# Patient Record
Sex: Female | Born: 1995 | Marital: Single | Smoking: Current every day smoker
Health system: Southern US, Community
[De-identification: ages and names within clinical notes are randomized; demographics above are authoritative.]

## PROBLEM LIST (undated history)

## (undated) DIAGNOSIS — Z789 Other specified health status: Secondary | ICD-10-CM

---
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
## 2015-01-21 ENCOUNTER — Emergency Department: Payer: Self-pay | Admitting: Emergency Medicine

## 2015-02-15 ENCOUNTER — Emergency Department: Payer: Self-pay | Admitting: Emergency Medicine

## 2015-02-18 LAB — OB RESULTS CONSOLE PLATELET COUNT: Platelets: 219 10*3/uL

## 2015-02-18 LAB — OB RESULTS CONSOLE HGB/HCT, BLOOD
HEMATOCRIT: 36 %
Hemoglobin: 11.8 g/dL

## 2015-02-18 LAB — OB RESULTS CONSOLE HEPATITIS B SURFACE ANTIGEN: Hepatitis B Surface Ag: NEGATIVE

## 2015-02-18 LAB — OB RESULTS CONSOLE ABO/RH: RH Type: POSITIVE

## 2015-02-18 LAB — OB RESULTS CONSOLE GC/CHLAMYDIA
Chlamydia: NEGATIVE
Gonorrhea: NEGATIVE

## 2015-02-18 LAB — OB RESULTS CONSOLE VARICELLA ZOSTER ANTIBODY, IGG: Varicella: IMMUNE

## 2015-02-18 LAB — OB RESULTS CONSOLE RPR: RPR: NONREACTIVE

## 2015-02-18 LAB — OB RESULTS CONSOLE HIV ANTIBODY (ROUTINE TESTING): HIV: NONREACTIVE

## 2015-02-18 LAB — OB RESULTS CONSOLE RUBELLA ANTIBODY, IGM: Rubella: IMMUNE

## 2015-02-18 LAB — OB RESULTS CONSOLE ANTIBODY SCREEN: Antibody Screen: NEGATIVE

## 2015-05-26 ENCOUNTER — Encounter: Payer: Self-pay | Admitting: *Deleted

## 2015-05-28 ENCOUNTER — Encounter: Payer: Self-pay | Admitting: Obstetrics and Gynecology

## 2015-06-11 ENCOUNTER — Encounter: Payer: Self-pay | Admitting: Obstetrics and Gynecology

## 2015-06-11 ENCOUNTER — Ambulatory Visit (INDEPENDENT_AMBULATORY_CARE_PROVIDER_SITE_OTHER): Payer: Medicaid Other | Admitting: Obstetrics and Gynecology

## 2015-06-11 VITALS — BP 121/82 | HR 80 | Ht 67.0 in | Wt 191.3 lb

## 2015-06-11 DIAGNOSIS — Z3493 Encounter for supervision of normal pregnancy, unspecified, third trimester: Secondary | ICD-10-CM

## 2015-06-11 LAB — POCT URINALYSIS DIPSTICK
BILIRUBIN UA: NEGATIVE
Blood, UA: NEGATIVE
GLUCOSE UA: NEGATIVE
Ketones, UA: NEGATIVE
LEUKOCYTES UA: NEGATIVE
NITRITE UA: NEGATIVE
Spec Grav, UA: 1.01
Urobilinogen, UA: 0.2
pH, UA: 7

## 2015-06-11 MED ORDER — ONDANSETRON 4 MG PO TBDP: 4.0000 mg | ORAL_TABLET | Freq: Four times a day (QID) | ORAL | Status: DC | PRN

## 2015-06-11 NOTE — Progress Notes (Signed)
Pt c/o nausea happens during the day

## 2015-06-11 NOTE — Progress Notes (Signed)
ROB- doing well except continued daily nausea with occasional vomiting- not relieved by diclegis.Glucola next visit; Plans to name female infant Boris LownKayden.  Encouraged to enroll in CBC, Breastfeeding and infant care classes.

## 2015-07-01 ENCOUNTER — Encounter: Payer: Medicaid Other | Admitting: Obstetrics and Gynecology

## 2015-07-24 ENCOUNTER — Ambulatory Visit (INDEPENDENT_AMBULATORY_CARE_PROVIDER_SITE_OTHER): Payer: Medicaid Other | Admitting: Obstetrics and Gynecology

## 2015-07-24 ENCOUNTER — Encounter: Payer: Self-pay | Admitting: Obstetrics and Gynecology

## 2015-07-24 VITALS — BP 129/73 | HR 89 | Wt 193.2 lb

## 2015-07-24 DIAGNOSIS — Z3493 Encounter for supervision of normal pregnancy, unspecified, third trimester: Secondary | ICD-10-CM | POA: Diagnosis not present

## 2015-07-24 DIAGNOSIS — Z23 Encounter for immunization: Secondary | ICD-10-CM | POA: Diagnosis not present

## 2015-07-24 LAB — POCT URINALYSIS DIPSTICK
Bilirubin, UA: NEGATIVE
Blood, UA: NEGATIVE
Glucose, UA: NEGATIVE
Ketones, UA: NEGATIVE
LEUKOCYTES UA: NEGATIVE
NITRITE UA: NEGATIVE
PH UA: 6.5
PROTEIN UA: NEGATIVE
Spec Grav, UA: 1.01
Urobilinogen, UA: 0.2

## 2015-07-24 MED ORDER — TETANUS-DIPHTH-ACELL PERTUSSIS 5-2.5-18.5 LF-MCG/0.5 IM SUSP
0.5000 mL | Freq: Once | INTRAMUSCULAR | Status: AC
  Administered 2015-07-24: 0.5 mL via INTRAMUSCULAR

## 2015-07-24 NOTE — Progress Notes (Signed)
ROB- denies any new complaints 

## 2015-07-24 NOTE — Addendum Note (Signed)
Addended by: Rosine Beat L on: 07/24/2015 12:20 PM   Modules accepted: Orders

## 2015-07-24 NOTE — Progress Notes (Signed)
Rob & Late Glucola- doing well w/o concerns, missed previous appointments due to work- needs note to excuse for dr appt, hasn't made it to classes for the same reason- will look up classes online. Glucola done and Tdap given, blood consent signed.  Also given note to excuse from traffic court on 09/11/15 due to being close to due date.

## 2015-07-25 ENCOUNTER — Telehealth: Payer: Self-pay | Admitting: *Deleted

## 2015-07-25 LAB — HEMOGLOBIN AND HEMATOCRIT, BLOOD
HEMATOCRIT: 32.8 % — AB (ref 34.0–46.6)
Hemoglobin: 11.3 g/dL (ref 11.1–15.9)

## 2015-07-25 LAB — GLUCOSE TOLERANCE, 1 HOUR: GLUCOSE, 1HR PP: 66 mg/dL (ref 65–199)

## 2015-07-25 NOTE — Telephone Encounter (Signed)
Notified pt of normal results 

## 2015-07-25 NOTE — Telephone Encounter (Signed)
-----   Message from Ulyses Amor, [place] sent at 07/25/2015  8:57 AM EDT ----- Please let her know she passed her glucoal and no signs of anemia

## 2015-08-07 ENCOUNTER — Ambulatory Visit (INDEPENDENT_AMBULATORY_CARE_PROVIDER_SITE_OTHER): Payer: Medicaid Other | Admitting: Obstetrics and Gynecology

## 2015-08-07 ENCOUNTER — Encounter: Payer: Self-pay | Admitting: Obstetrics and Gynecology

## 2015-08-07 VITALS — BP 128/82 | HR 81 | Wt 197.3 lb

## 2015-08-07 DIAGNOSIS — Z331 Pregnant state, incidental: Secondary | ICD-10-CM

## 2015-08-07 LAB — POCT URINALYSIS DIPSTICK
Bilirubin, UA: NEGATIVE
Blood, UA: NEGATIVE
GLUCOSE UA: NEGATIVE
Ketones, UA: 5
LEUKOCYTES UA: NEGATIVE
NITRITE UA: NEGATIVE
SPEC GRAV UA: 1.01
UROBILINOGEN UA: 0.2
pH, UA: 6

## 2015-08-07 NOTE — Progress Notes (Signed)
ROB-denies any complaints 

## 2015-08-07 NOTE — Progress Notes (Signed)
ROB- doing well, no concerns, plans FOB and mom for labor support

## 2015-08-21 ENCOUNTER — Ambulatory Visit (INDEPENDENT_AMBULATORY_CARE_PROVIDER_SITE_OTHER): Payer: Medicaid Other | Admitting: Obstetrics and Gynecology

## 2015-08-21 ENCOUNTER — Encounter: Payer: Self-pay | Admitting: Obstetrics and Gynecology

## 2015-08-21 VITALS — BP 127/76 | HR 82 | Wt 196.1 lb

## 2015-08-21 DIAGNOSIS — Z3685 Encounter for antenatal screening for Streptococcus B: Secondary | ICD-10-CM

## 2015-08-21 DIAGNOSIS — Z36 Encounter for antenatal screening of mother: Secondary | ICD-10-CM

## 2015-08-21 DIAGNOSIS — Z113 Encounter for screening for infections with a predominantly sexual mode of transmission: Secondary | ICD-10-CM

## 2015-08-21 DIAGNOSIS — Z3493 Encounter for supervision of normal pregnancy, unspecified, third trimester: Secondary | ICD-10-CM

## 2015-08-21 LAB — POCT URINALYSIS DIPSTICK
BILIRUBIN UA: NEGATIVE
GLUCOSE UA: NEGATIVE
Ketones, UA: NEGATIVE
Leukocytes, UA: NEGATIVE
Nitrite, UA: NEGATIVE
RBC UA: NEGATIVE
SPEC GRAV UA: 1.01
Urobilinogen, UA: 0.2
pH, UA: 6.5

## 2015-08-21 NOTE — Patient Instructions (Signed)
Braxton Hicks Contractions °Contractions of the uterus can occur throughout pregnancy. Contractions are not always a sign that you are in labor.  °WHAT ARE BRAXTON HICKS CONTRACTIONS?  °Contractions that occur before labor are called Braxton Hicks contractions, or false labor. Toward the end of pregnancy (32-34 weeks), these contractions can develop more often and may become more forceful. This is not true labor because these contractions do not result in opening (dilatation) and thinning of the cervix. They are sometimes difficult to tell apart from true labor because these contractions can be forceful and people have different pain tolerances. You should not feel embarrassed if you go to the hospital with false labor. Sometimes, the only way to tell if you are in true labor is for your health care provider to look for changes in the cervix. °If there are no prenatal problems or other health problems associated with the pregnancy, it is completely safe to be sent home with false labor and await the onset of true labor. °HOW CAN YOU TELL THE DIFFERENCE BETWEEN TRUE AND FALSE LABOR? °False Labor °· The contractions of false labor are usually shorter and not as hard as those of true labor.   °· The contractions are usually irregular.   °· The contractions are often felt in the front of the lower abdomen and in the groin.   °· The contractions may go away when you walk around or change positions while lying down.   °· The contractions get weaker and are shorter lasting as time goes on.   °· The contractions do not usually become progressively stronger, regular, and closer together as with true labor.   °True Labor °· Contractions in true labor last 30-70 seconds, become very regular, usually become more intense, and increase in frequency.   °· The contractions do not go away with walking.   °· The discomfort is usually felt in the top of the uterus and spreads to the lower abdomen and low back.   °· True labor can be  determined by your health care provider with an exam. This will show that the cervix is dilating and getting thinner.   °WHAT TO REMEMBER °· Keep up with your usual exercises and follow other instructions given by your health care provider.   °· Take medicines as directed by your health care provider.   °· Keep your regular prenatal appointments.   °· Eat and drink lightly if you think you are going into labor.   °· If Braxton Hicks contractions are making you uncomfortable:   °¨ Change your position from lying down or resting to walking, or from walking to resting.   °¨ Sit and rest in a tub of warm water.   °¨ Drink 2-3 glasses of water. Dehydration may cause these contractions.   °¨ Do slow and deep breathing several times an hour.   °WHEN SHOULD I SEEK IMMEDIATE MEDICAL CARE? °Seek immediate medical care if: °· Your contractions become stronger, more regular, and closer together.   °· You have fluid leaking or gushing from your vagina.   °· You have a fever.   °· You pass blood-tinged mucus.   °· You have vaginal bleeding.   °· You have continuous abdominal pain.   °· You have low back pain that you never had before.   °· You feel your baby's head pushing down and causing pelvic pressure.   °· Your baby is not moving as much as it used to.   °Document Released: 11/29/2005 Document Revised: 12/04/2013 Document Reviewed: 09/10/2013 °ExitCare® Patient Information ©2015 [employer]. This information is not intended to replace advice given to you by your health care   provider. Make sure you discuss any questions you have with your health care provider. ° °

## 2015-08-21 NOTE — Progress Notes (Signed)
ROB-some low back pain, cultures obtained

## 2015-08-21 NOTE — Progress Notes (Signed)
ROB- doing well, occasional cramping with low back pain, labor precautions discussed.

## 2015-08-22 LAB — GC/CHLAMYDIA PROBE AMP
CHLAMYDIA, DNA PROBE: NEGATIVE
Neisseria gonorrhoeae by PCR: NEGATIVE

## 2015-08-23 LAB — STREP GP B NAA: Strep Gp B NAA: NEGATIVE

## 2015-08-28 ENCOUNTER — Ambulatory Visit (INDEPENDENT_AMBULATORY_CARE_PROVIDER_SITE_OTHER): Payer: Medicaid Other | Admitting: Obstetrics and Gynecology

## 2015-08-28 ENCOUNTER — Encounter: Payer: Self-pay | Admitting: Obstetrics and Gynecology

## 2015-08-28 VITALS — BP 128/82 | HR 82 | Wt 196.6 lb

## 2015-08-28 DIAGNOSIS — Z3493 Encounter for supervision of normal pregnancy, unspecified, third trimester: Secondary | ICD-10-CM

## 2015-08-28 LAB — POCT URINALYSIS DIPSTICK
Bilirubin, UA: NEGATIVE
Blood, UA: NEGATIVE
GLUCOSE UA: NEGATIVE
KETONES UA: NEGATIVE
Leukocytes, UA: NEGATIVE
Nitrite, UA: NEGATIVE
Protein, UA: NEGATIVE
SPEC GRAV UA: 1.01
Urobilinogen, UA: 0.2
pH, UA: 7

## 2015-08-28 NOTE — Progress Notes (Signed)
ROB- doing well, notified of negative cultures

## 2015-08-28 NOTE — Progress Notes (Signed)
ROB- denies any new complaints 

## 2015-09-04 ENCOUNTER — Ambulatory Visit (INDEPENDENT_AMBULATORY_CARE_PROVIDER_SITE_OTHER): Payer: Medicaid Other | Admitting: Obstetrics and Gynecology

## 2015-09-04 ENCOUNTER — Encounter: Payer: Medicaid Other | Admitting: Obstetrics and Gynecology

## 2015-09-04 ENCOUNTER — Encounter: Payer: Self-pay | Admitting: Obstetrics and Gynecology

## 2015-09-04 VITALS — BP 130/74 | HR 88 | Wt 199.7 lb

## 2015-09-04 DIAGNOSIS — Z3493 Encounter for supervision of normal pregnancy, unspecified, third trimester: Secondary | ICD-10-CM

## 2015-09-04 LAB — POCT URINALYSIS DIPSTICK
Bilirubin, UA: NEGATIVE
GLUCOSE UA: NEGATIVE
Ketones, UA: NEGATIVE
Leukocytes, UA: NEGATIVE
NITRITE UA: NEGATIVE
Protein, UA: NEGATIVE
RBC UA: NEGATIVE
Spec Grav, UA: 1.01
UROBILINOGEN UA: 0.2
pH, UA: 6.5

## 2015-09-04 NOTE — Progress Notes (Signed)
ROB-denies any complaints 

## 2015-09-04 NOTE — Progress Notes (Signed)
ROB- doing well with no changes

## 2015-09-09 ENCOUNTER — Ambulatory Visit (INDEPENDENT_AMBULATORY_CARE_PROVIDER_SITE_OTHER): Payer: Medicaid Other | Admitting: Obstetrics and Gynecology

## 2015-09-09 ENCOUNTER — Encounter: Payer: Self-pay | Admitting: Obstetrics and Gynecology

## 2015-09-09 VITALS — BP 126/77 | HR 92 | Wt 200.1 lb

## 2015-09-09 DIAGNOSIS — Z3493 Encounter for supervision of normal pregnancy, unspecified, third trimester: Secondary | ICD-10-CM

## 2015-09-09 LAB — POCT URINALYSIS DIPSTICK
Bilirubin, UA: NEGATIVE
Blood, UA: NEGATIVE
Glucose, UA: NEGATIVE
Ketones, UA: NEGATIVE
LEUKOCYTES UA: NEGATIVE
NITRITE UA: 0.2
Spec Grav, UA: 1.015
UROBILINOGEN UA: NEGATIVE
pH, UA: 6

## 2015-09-09 NOTE — Progress Notes (Signed)
ROB- doing well, discussed routine post-date care and testing.

## 2015-09-09 NOTE — Progress Notes (Signed)
ROB-denies any complaints 

## 2015-09-09 NOTE — Patient Instructions (Signed)
Nonstress Test °The nonstress test is a procedure that monitors the fetus's heartbeat. The test will monitor the heartbeat when the fetus is at rest and while the fetus is moving. In a healthy fetus, there will be an increase in fetal heart rate when the fetus moves or kicks. The heart rate will decrease at rest. This test helps determine if the fetus is healthy. Your health care Alexandra Mullins will look at a number of patterns in the heart rate tracing to make sure your baby is thriving. If there is concern, your health care Alexandra Mullins may order additional tests or may suggest another course of action. This test is often done in the third trimester and can help determine if an early delivery is needed and safe. Common reasons to have this test are: °· You are past your due date. °· You have a high-risk pregnancy. °· You are feeling less movement than normal. °· You have lost a pregnancy in the past. °· Your health care Alexandra Mullins suspects fetal growth problems. °· You have too much or too little amniotic fluid. °BEFORE THE PROCEDURE °· Eat a meal right before the test or as directed by your health care Alexandra Mullins. Food may help stimulate fetal movements. °· Use the restroom right before the test. °PROCEDURE °· Two belts will be placed around your abdomen. These belts have monitors attached to them. One records the fetal heart rate and the other records uterine contractions. °· You may be asked to lie down on your side or to stay sitting upright. °· You may be given a button to press when you feel movement. °· The fetal heartbeat is listened to and watched on a screen. The heartbeat is recorded on a sheet of paper. °· If the fetus seems to be sleeping, you may be asked to drink some juice or soda, gently press your abdomen, or make some noise to wake the fetus. °AFTER THE PROCEDURE  °Your health care Alexandra Mullins will discuss the test results with you and make recommendations for the near future. °Document Released: 11/19/2002  Document Revised: 04/15/2014 Document Reviewed: 01/02/2013 °ExitCare® Patient Information ©2015 [employer]. This information is not intended to replace advice given to you by your health care Alexandra Mullins. Make sure you discuss any questions you have with your health care Alexandra Mullins. ° °

## 2015-09-10 ENCOUNTER — Encounter: Payer: Medicaid Other | Admitting: Obstetrics and Gynecology

## 2015-09-11 ENCOUNTER — Encounter: Payer: Medicaid Other | Admitting: Obstetrics and Gynecology

## 2015-09-16 ENCOUNTER — Ambulatory Visit (INDEPENDENT_AMBULATORY_CARE_PROVIDER_SITE_OTHER): Payer: Medicaid Other | Admitting: Obstetrics and Gynecology

## 2015-09-16 ENCOUNTER — Ambulatory Visit: Payer: Medicaid Other

## 2015-09-16 ENCOUNTER — Encounter: Payer: Self-pay | Admitting: Obstetrics and Gynecology

## 2015-09-16 VITALS — BP 127/87 | HR 79 | Wt 201.9 lb

## 2015-09-16 DIAGNOSIS — Z3493 Encounter for supervision of normal pregnancy, unspecified, third trimester: Secondary | ICD-10-CM | POA: Diagnosis not present

## 2015-09-16 LAB — POCT URINALYSIS DIPSTICK
BILIRUBIN UA: NEGATIVE
Glucose, UA: NEGATIVE
KETONES UA: NEGATIVE
LEUKOCYTES UA: NEGATIVE
Nitrite, UA: NEGATIVE
PH UA: 6
PROTEIN UA: NEGATIVE
RBC UA: NEGATIVE
SPEC GRAV UA: 1.015
Urobilinogen, UA: 0.2

## 2015-09-16 NOTE — Progress Notes (Signed)
NST performed today was reviewed and was found to be reactive. Baseline 120 with Moderate variability; No decels noted.  Continue recommended antenatal testing and prenatal care. Denies any changes since last visit.  Ultrasound today reveals:  Indications:Post Dates, EFW, AFI Findings:  Singleton intrauterine pregnancy is visualized with FHR at 116 BPM. Biometrics give an (U/S) Gestational age of 20 4/7 weeks and an (U/S) EDD of 09/19/2015; this correlates with the clinically established EDD of 09/13/2015.  Fetal presentation is Vertex.  EFW: 3954 g, 8 lb 11 oz, 78%. Placenta: fundal. AFI: 10.4 cm.  Follow up anatomic survey is complete and normal; Gender - female  .   Survey of the adnexa demonstrates no adnexal masses. There is no free peritoneal fluid in the cul de sac.  Impression: 1. 39 4/7 week Viable Singleton Intrauterine pregnancy by U/S. 2. (U/S) EDD is consistent with Clinically established (LMP) EDD of 09/13/2015. 3. Normal Follow up anatomy Scan  Recommendations: 1.Clinical correlation with the patient's History and Physical Exam.   Lenoria Farrier, Rad Tech  Scan reviewed and agree with findings; reviewed with patient and FOB.  Melody Ines Bloomer, CNM

## 2015-09-16 NOTE — Progress Notes (Signed)
ROB

## 2015-09-19 ENCOUNTER — Ambulatory Visit (INDEPENDENT_AMBULATORY_CARE_PROVIDER_SITE_OTHER): Payer: Medicaid Other | Admitting: Obstetrics and Gynecology

## 2015-09-19 ENCOUNTER — Encounter: Payer: Self-pay | Admitting: Obstetrics and Gynecology

## 2015-09-19 VITALS — BP 130/77 | HR 97 | Wt 203.4 lb

## 2015-09-19 DIAGNOSIS — Z3493 Encounter for supervision of normal pregnancy, unspecified, third trimester: Secondary | ICD-10-CM

## 2015-09-19 DIAGNOSIS — O480 Post-term pregnancy: Secondary | ICD-10-CM

## 2015-09-19 LAB — POCT URINALYSIS DIPSTICK
BILIRUBIN UA: NEGATIVE
Blood, UA: NEGATIVE
GLUCOSE UA: NEGATIVE
Ketones, UA: NEGATIVE
LEUKOCYTES UA: NEGATIVE
NITRITE UA: NEGATIVE
Protein, UA: NEGATIVE
Spec Grav, UA: 1.01
Urobilinogen, UA: 0.2
pH, UA: 6

## 2015-09-19 NOTE — Progress Notes (Signed)
Alexandra Mullins is having some contractions, slight pelvic pain

## 2015-09-19 NOTE — Progress Notes (Signed)
ROB & NST- doing well, NST reactive with no decels, labor precautions discussed and planned IOL 09/23/15.

## 2015-09-19 NOTE — Patient Instructions (Signed)
Labor Induction Labor induction is when steps are taken to cause a pregnant woman to begin the labor process. Most women go into labor on their own between 37 weeks and 42 weeks of the pregnancy. When this does not happen or when there is a medical need, methods may be used to induce labor. Labor induction causes a pregnant woman's uterus to contract. It also causes the cervix to soften (ripen), open (dilate), and thin out (efface). Usually, labor is not induced before 39 weeks of the pregnancy unless there is a problem with the baby or mother.  Before inducing labor, your health care provider will consider a number of factors, including the following:  The medical condition of you and the baby.   How many weeks along you are.   The status of the baby's lung maturity.   The condition of the cervix.   The position of the baby.  WHAT ARE THE REASONS FOR LABOR INDUCTION? Labor may be induced for the following reasons:  The health of the baby or mother is at risk.   The pregnancy is overdue by 1 week or more.   The water breaks but labor does not start on its own.   The mother has a health condition or serious illness, such as high blood pressure, infection, placental abruption, or diabetes.  The amniotic fluid amounts are low around the baby.   The baby is distressed.  Convenience or wanting the baby to be born on a certain date is not a reason for inducing labor. WHAT METHODS ARE USED FOR LABOR INDUCTION? Several methods of labor induction may be used, such as:   Prostaglandin medicine. This medicine causes the cervix to dilate and ripen. The medicine will also start contractions. It can be taken by mouth or by inserting a suppository into the vagina.   Inserting a thin tube (catheter) with a balloon on the end into the vagina to dilate the cervix. Once inserted, the balloon is expanded with water, which causes the cervix to open.   Stripping the membranes. Your health  care provider separates amniotic sac tissue from the cervix, causing the cervix to be stretched and causing the release of a hormone called progesterone. This may cause the uterus to contract. It is often done during an office visit. You will be sent home to wait for the contractions to begin. You will then come in for an induction.   Breaking the water. Your health care provider makes a hole in the amniotic sac using a small instrument. Once the amniotic sac breaks, contractions should begin. This may still take hours to see an effect.   Medicine to trigger or strengthen contractions. This medicine is given through an IV access tube inserted into a vein in your arm.  All of the methods of induction, besides stripping the membranes, will be done in the hospital. Induction is done in the hospital so that you and the baby can be carefully monitored.  HOW LONG DOES IT TAKE FOR LABOR TO BE INDUCED? Some inductions can take up to 2-3 days. Depending on the cervix, it usually takes less time. It takes longer when you are induced early in the pregnancy or if this is your first pregnancy. If a mother is still pregnant and the induction has been going on for 2-3 days, either the mother will be sent home or a cesarean delivery will be needed. WHAT ARE THE RISKS ASSOCIATED WITH LABOR INDUCTION? Some of the risks of induction include:     Changes in fetal heart rate, such as too high, too low, or erratic.   Fetal distress.   Chance of infection for the mother and baby.   Increased chance of having a cesarean delivery.   Breaking off (abruption) of the placenta from the uterus (rare).   Uterine rupture (very rare).  When induction is needed for medical reasons, the benefits of induction may outweigh the risks. WHAT ARE SOME REASONS FOR NOT INDUCING LABOR? Labor induction should not be done if:   It is shown that your baby does not tolerate labor.   You have had previous surgeries on your  uterus, such as a myomectomy or the removal of fibroids.   Your placenta lies very low in the uterus and blocks the opening of the cervix (placenta previa).   Your baby is not in a head-down position.   The umbilical cord drops down into the birth canal in front of the baby. This could cut off the baby's blood and oxygen supply.   You have had a previous cesarean delivery.   There are unusual circumstances, such as the baby being extremely premature.    This information is not intended to replace advice given to you by your health care provider. Make sure you discuss any questions you have with your health care provider.   Document Released: 04/20/2007 Document Revised: 12/20/2014 Document Reviewed: 06/28/2013 Elsevier Interactive Patient Education 2016 Else[employer] Inc.  

## 2015-09-23 ENCOUNTER — Encounter: Payer: Medicaid Other | Admitting: Obstetrics and Gynecology

## 2015-09-23 ENCOUNTER — Inpatient Hospital Stay
Admission: EM | Admit: 2015-09-23 | Discharge: 2015-09-27 | DRG: 775 | Disposition: A | Discharge status: Home / Self Care | Payer: Medicaid Other | Attending: Obstetrics and Gynecology | Admitting: Obstetrics and Gynecology

## 2015-09-23 ENCOUNTER — Encounter: Payer: Self-pay | Admitting: *Deleted

## 2015-09-23 DIAGNOSIS — Z87891 Personal history of nicotine dependence: Secondary | ICD-10-CM

## 2015-09-23 DIAGNOSIS — Z3403 Encounter for supervision of normal first pregnancy, third trimester: Secondary | ICD-10-CM | POA: Diagnosis not present

## 2015-09-23 DIAGNOSIS — O480 Post-term pregnancy: Secondary | ICD-10-CM | POA: Diagnosis present

## 2015-09-23 DIAGNOSIS — Z3A41 41 weeks gestation of pregnancy: Secondary | ICD-10-CM

## 2015-09-23 DIAGNOSIS — O9081 Anemia of the puerperium: Secondary | ICD-10-CM | POA: Diagnosis present

## 2015-09-23 HISTORY — DX: Other specified health status: Z78.9

## 2015-09-23 LAB — TYPE AND SCREEN
ABO/RH(D): A POS
ANTIBODY SCREEN: NEGATIVE

## 2015-09-23 LAB — CHLAMYDIA/NGC RT PCR (ARMC ONLY)
CHLAMYDIA TR: NOT DETECTED
N GONORRHOEAE: NOT DETECTED

## 2015-09-23 LAB — CBC
HCT: 36.7 % (ref 35.0–47.0)
HEMOGLOBIN: 12.2 g/dL (ref 12.0–16.0)
MCH: 28.1 pg (ref 26.0–34.0)
MCHC: 33.3 g/dL (ref 32.0–36.0)
MCV: 84.4 fL (ref 80.0–100.0)
PLATELETS: 205 10*3/uL (ref 150–440)
RBC: 4.34 MIL/uL (ref 3.80–5.20)
RDW: 13.7 % (ref 11.5–14.5)
WBC: 7.9 10*3/uL (ref 3.6–11.0)

## 2015-09-23 LAB — ABO/RH: ABO/RH(D): A POS

## 2015-09-23 MED ORDER — LIDOCAINE HCL (PF) 1 % IJ SOLN: 30.0000 mL | INTRAMUSCULAR | Status: DC | PRN

## 2015-09-23 MED ORDER — LACTATED RINGERS IV SOLN
INTRAVENOUS | Status: DC
  Administered 2015-09-24: 125 mL/h via INTRAVENOUS
  Administered 2015-09-24: 11:00:00 via INTRAVENOUS
  Administered 2015-09-25: 125 mL/h via INTRAVENOUS

## 2015-09-23 MED ORDER — FENTANYL CITRATE (PF) 100 MCG/2ML IJ SOLN: 50.0000 ug | INTRAMUSCULAR | Status: DC | PRN

## 2015-09-23 MED ORDER — ZOLPIDEM TARTRATE 5 MG PO TABS
5.0000 mg | ORAL_TABLET | Freq: Every evening | ORAL | Status: DC | PRN
  Administered 2015-09-23: 5 mg via ORAL
  Filled 2015-09-23: qty 1

## 2015-09-23 MED ORDER — MISOPROSTOL 25 MCG QUARTER TABLET
50.0000 ug | ORAL_TABLET | ORAL | Status: DC | PRN
  Administered 2015-09-24: 50 ug via VAGINAL
  Filled 2015-09-23 (×2): qty 1

## 2015-09-23 MED ORDER — OXYTOCIN BOLUS FROM INFUSION: 500.0000 mL | INTRAVENOUS | Status: DC

## 2015-09-23 MED ORDER — TERBUTALINE SULFATE 1 MG/ML IJ SOLN: 0.2500 mg | Freq: Once | INTRAMUSCULAR | Status: DC | PRN

## 2015-09-23 MED ORDER — MISOPROSTOL 25 MCG QUARTER TABLET
25.0000 ug | ORAL_TABLET | ORAL | Status: DC | PRN
  Administered 2015-09-23: 50 ug via VAGINAL
  Filled 2015-09-23: qty 1

## 2015-09-23 MED ORDER — LACTATED RINGERS IV SOLN: 500.0000 mL | INTRAVENOUS | Status: DC | PRN

## 2015-09-23 MED ORDER — OXYTOCIN 40 UNITS IN LACTATED RINGERS INFUSION - SIMPLE MED
62.5000 mL/h | INTRAVENOUS | Status: DC
  Filled 2015-09-23: qty 1000

## 2015-09-23 MED ORDER — ONDANSETRON HCL 4 MG/2ML IJ SOLN: 4.0000 mg | Freq: Four times a day (QID) | INTRAMUSCULAR | Status: DC | PRN

## 2015-09-23 MED ORDER — BUTORPHANOL TARTRATE 1 MG/ML IJ SOLN
2.0000 mg | INTRAMUSCULAR | Status: DC | PRN
  Administered 2015-09-24 (×3): 2 mg via INTRAVENOUS
  Filled 2015-09-23 (×3): qty 2

## 2015-09-23 NOTE — H&P (Signed)
Obstetric History and Physical  Verdine Grenfell is a 19 y.o. G1P0 with IUP at [redacted]w[redacted]d presenting for induction of labor. Patient states she has been having  Irregular contractions, none vaginal bleeding, intact membranes, with active fetal movement.    Prenatal Course Source of Care: [facility]  Pregnancy complications or risks: none  Prenatal labs and studies: ABO, Rh: --/--/A POS (10/11 1939) Antibody: NEG (10/11 1939) Rubella: Immune (03/08 0000) RPR: Nonreactive (03/08 0000)  HBsAg: Negative (03/08 0000)  HIV: Non-reactive (03/08 0000)  GBS:  1 hr Glucola  WNL Genetic screening normal Anatomy US normal  Past Medical History  Diagnosis Date  . Medical history non-contributory     History reviewed. No pertinent past surgical history.  OB History  Gravida Para Term Preterm AB SAB TAB Ectopic Multiple Living  1             # Outcome Date GA Lbr Len/2nd Weight Sex Delivery Anes PTL Lv  1 Current               Social History   Social History  . Marital Status: Single    Spouse Name: N/A  . Number of Children: N/A  . Years of Education: N/A   Social History Main Topics  . Smoking status: Former Smoker    Quit date: 01/23/2015  . Smokeless tobacco: Never Used  . Alcohol Use: No  . Drug Use: No  . Sexual Activity: Yes    Birth Control/ Protection: None   Other Topics Concern  . None   Social History Narrative    History reviewed. No pertinent family history.  Prescriptions prior to admission  Medication Sig Dispense Refill Last Dose  . Prenatal Vit-Fe Fumarate-FA (PRENATAL MULTIVITAMIN) TABS tablet Take 1 tablet by mouth daily at 12 noon.   Taking    No Known Allergies  Review of Systems: Negative except for what is mentioned in HPI.  Physical Exam: BP 121/83 mmHg  Pulse 87  Temp(Src) 98 F (36.7 C) (Oral)  Resp 16  Ht  (1.702 m)  Wt 92.08 kg (203 lb)  BMI 31.79 kg/m2  LMP 12/07/2014 (LMP Unknown) GENERAL: Well-developed, well-nourished female in no  acute distress.  LUNGS: Clear to auscultation bilaterally.  HEART: Regular rate and rhythm. ABDOMEN: Soft, nontender, nondistended, gravid. EXTREMITIES: Nontender, no edema, 2+ distal pulses. Cervical Exam: Dilation: Closed Exam by:: C.Elder, CNM student 1st dose of misoprostol placed FHT:  Baseline rate 110 bpm   Variability moderate  Accelerations present   Decelerations none Contractions: Every 5 mins, mild per palp   Pertinent Labs/Studies:   Results for orders placed or performed during the hospital encounter of 09/23/15 (from the past 24 hour(s))  CBC     Status: None   Collection Time: 09/23/15  7:39 PM  Result Value Ref Range   WBC 7.9 3.6 - 11.0 K/uL   RBC 4.34 3.80 - 5.20 MIL/uL   Hemoglobin 12.2 12.0 - 16.0 g/dL   HCT 16.1 09.6 - 04.5 %   MCV 84.4 80.0 - 100.0 fL   MCH 28.1 26.0 - 34.0 pg   MCHC 33.3 32.0 - 36.0 g/dL   RDW 40.9 81.1 - 91.4 %   Platelets 205 150 - 440 K/uL  Type and screen [facility]     Status: None   Collection Time: 09/23/15  7:39 PM  Result Value Ref Range   ABO/RH(D) A POS    Antibody Screen NEG    Sample Expiration  09/26/2015     Assessment : Ralonda Tartt is a 19 y.o. G1P0 at [redacted]w[redacted]d being admitted for post dates induction of labor.  Plan: Labor: Expectant management.  Induction/Augmentation as needed, per protocol with misoprostol  FWB: Reassuring fetal heart tracing.  GBS negative Delivery plan: Hopeful for vaginal delivery Seen with Montell Leopard Ines Bloomer, CNM [facility]  Cassandra Elder, RN-C, SNM

## 2015-09-24 ENCOUNTER — Inpatient Hospital Stay: Payer: Medicaid Other | Admitting: Anesthesiology

## 2015-09-24 LAB — PLATELET COUNT: Platelets: 179 10*3/uL (ref 150–440)

## 2015-09-24 MED ORDER — SODIUM CHLORIDE 0.9 % IJ SOLN
INTRAMUSCULAR | Status: AC
  Filled 2015-09-24: qty 6

## 2015-09-24 MED ORDER — FENTANYL 2.5 MCG/ML W/ROPIVACAINE 0.2% IN NS 100 ML EPIDURAL INFUSION (ARMC-ANES)
EPIDURAL | Status: AC
  Administered 2015-09-24: 10 mL/h via EPIDURAL
  Filled 2015-09-24: qty 100

## 2015-09-24 MED ORDER — INFLUENZA VAC SPLIT QUAD 0.5 ML IM SUSY: 0.5000 mL | PREFILLED_SYRINGE | INTRAMUSCULAR | Status: DC

## 2015-09-24 MED ORDER — OXYTOCIN 40 UNITS IN LACTATED RINGERS INFUSION - SIMPLE MED
1.0000 m[IU]/min | INTRAVENOUS | Status: DC
  Administered 2015-09-24 (×2): 1 m[IU]/min via INTRAVENOUS

## 2015-09-24 MED ORDER — LIDOCAINE HCL (PF) 1 % IJ SOLN
INTRAMUSCULAR | Status: DC
Start: 2015-09-24 — End: 2015-09-25
  Filled 2015-09-24: qty 30

## 2015-09-24 MED ORDER — OXYTOCIN 10 UNIT/ML IJ SOLN
INTRAMUSCULAR | Status: AC
  Filled 2015-09-24: qty 2

## 2015-09-24 MED ORDER — MISOPROSTOL 200 MCG PO TABS
ORAL_TABLET | ORAL | Status: AC
  Administered 2015-09-25: 800 ug via RECTAL
  Filled 2015-09-24: qty 4

## 2015-09-24 MED ORDER — BETAMETHASONE SOD PHOS & ACET 6 (3-3) MG/ML IJ SUSP: 12.0000 mg | Freq: Once | INTRAMUSCULAR | Status: DC

## 2015-09-24 MED ORDER — AMMONIA AROMATIC IN INHA
RESPIRATORY_TRACT | Status: AC
  Filled 2015-09-24: qty 10

## 2015-09-24 NOTE — Progress Notes (Signed)
Dr Henrene HawkingKephart requesting epidural infusion rate be turned up to 20 cc/h x 1 h, with pt on L side

## 2015-09-24 NOTE — Progress Notes (Signed)
Pt unable to discern her level of numbness well in regards to testing with cold alcohol swab. Pt very vague- states, "I don't know, it just hurts here!" Pt indicating L lower abd and supra pubic area. Pt able to move legs well, reports no sensation of numbness or tingling.

## 2015-09-24 NOTE — Progress Notes (Signed)
Alexandra Mullins is a 19 y.o. G1P0 at 4444w4d by LMP admitted for induction of labor due to Post dates. Due date 09/13/2015.  Subjective:   Objective: BP 128/86 mmHg  Pulse 83  Temp(Src) 98.1 F (36.7 C) (Oral)  Resp 16  Ht 5\' 7"  (1.702 m)  Wt 92.08 kg (203 lb)  BMI 31.79 kg/m2  LMP 12/07/2014 (LMP Unknown)      FHT:  FHR: 115-120 bpm, variability: minimal to moderate,  accelerations:  Present,  decelerations:  Absent UC:   irregular, mild per palp SVE:   Dilation: 3 Effacement (%): 70 Exam by:: cke  Labs: Lab Results  Component Value Date   WBC 7.9 09/23/2015   HGB 12.2 09/23/2015   HCT 36.7 09/23/2015   MCV 84.4 09/23/2015   PLT 205 09/23/2015    Assessment / Plan: Postterm pregnancy for IOL, misoprostol x 3 doses will begin pitocin per protocol  Labor: Progressing normally and Dr. Valentino Mullins aware of POC Preeclampsia:  labs stable Fetal Wellbeing:  Category II Pain Control:  Labor support without medications I/D:  n/a Anticipated MOD:  NSVD  Seen with  Alexandra Mullins 09/24/2015, 10:27 AM   Alexandra Isleassandra Elder, RN-C, SNM

## 2015-09-24 NOTE — Anesthesia Preprocedure Evaluation (Signed)
Anesthesia Evaluation  Patient identified by MRN, date of birth, ID band Patient awake    Reviewed: Allergy & Precautions, NPO status , Patient's Chart, lab work & pertinent test results  History of Anesthesia Complications Negative for: history of anesthetic complications  Airway Mallampati: II       Dental  (+) Teeth Intact   Pulmonary neg pulmonary ROS, former smoker,           Cardiovascular negative cardio ROS       Neuro/Psych    GI/Hepatic negative GI ROS, Neg liver ROS,   Endo/Other  negative endocrine ROS  Renal/GU negative Renal ROS     Musculoskeletal   Abdominal   Peds  Hematology negative hematology ROS (+)   Anesthesia Other Findings   Reproductive/Obstetrics                             Anesthesia Physical Anesthesia Plan  ASA: II  Anesthesia Plan: Epidural   Post-op Pain Management:    Induction:   Airway Management Planned:   Additional Equipment:   Intra-op Plan:   Post-operative Plan:   Informed Consent: I have reviewed the patients History and Physical, chart, labs and discussed the procedure including the risks, benefits and alternatives for the proposed anesthesia with the patient or authorized representative who has indicated his/her understanding and acceptance.     Plan Discussed with:   Anesthesia Plan Comments:         Anesthesia Quick Evaluation

## 2015-09-24 NOTE — Anesthesia Procedure Notes (Signed)
Epidural Patient location during procedure: OB  Staffing Performed by: anesthesiologist   Preanesthetic Checklist Completed: patient identified, site marked, surgical consent, pre-op evaluation, timeout performed, IV checked, risks and benefits discussed and monitors and equipment checked  Epidural Patient position: sitting Prep: Betadine Patient monitoring: heart rate, continuous pulse ox and blood pressure Approach: midline Location: L4-L5 Injection technique: LOR saline  Needle:  Needle type: Tuohy  Needle gauge: 18 G Needle length: 9 cm and 9 Needle insertion depth: 8 cm Catheter type: closed end flexible Catheter size: 20 Guage Catheter at skin depth: 10 cm Test dose: negative and 1.5% lidocaine with Epi 1:200 K  Assessment Events: blood not aspirated, injection not painful, no injection resistance, negative IV test and no paresthesia  Additional Notes   Patient tolerated the insertion well without complications.Reason for block:procedure for pain

## 2015-09-24 NOTE — Progress Notes (Signed)
Alexandra Mullins is a 19 y.o. G1P0 at 6639w4d by LMP admitted for induction of labor due to Post dates.   Subjective:   Objective: BP 139/88 mmHg  Pulse 80  Temp(Src) 98.1 F (36.7 C) (Oral)  Resp 16  Ht 5\' 7"  (1.702 m)  Wt 92.08 kg (203 lb)  BMI 31.79 kg/m2  LMP 12/07/2014 (LMP Unknown)      FHT:  FHR: 115 bpm, variability: moderate,  accelerations:  Present,  decelerations:  Absent UC:   irregular, every 2-5 minutes, no pitocin, IUPC replaced, uterus soft tone per palp with IUPC registering hi resting tone SVE:   Dilation: 5 Effacement (%): 80 Station: -1 Exam by:: MNB  Labs: Lab Results  Component Value Date   WBC 7.9 09/23/2015   HGB 12.2 09/23/2015   HCT 36.7 09/23/2015   MCV 84.4 09/23/2015   PLT 205 09/23/2015    Assessment / Plan: Induction of labor due to postterm,  progressing well on pitocin. Have used birthing ball, peanut ball & knee chest position to facilitate fetal descent.   Labor: slow cervical change believed to inadequate UC's, tachysystole prompting prompting pitocin to be discontinued and LOP presentation Preeclampsia:  labs stable Fetal Wellbeing:  Category I  Pain Control:  received Stadol x 1, tolerating current UC's well I/D:  n/a Anticipated MOD:  NSVD  Seen with  Alexandra Mullins 09/24/2015, 5:56 PM   Cassandra Elder, RN-C, SNM

## 2015-09-24 NOTE — Progress Notes (Signed)
Mariea Clontsngel Fanguy is a 19 y.o. G1P0 at 6967w4d by LMP admitted for induction of labor due to Post dates. Due date 09/13/15.  Subjective: Reports continued pain in lower back with contractions  Objective: BP 134/86 mmHg  Pulse 77  Temp(Src) 98.1 F (36.7 C) (Oral)  Resp 16  Ht 5\' 7"  (1.702 m)  Wt 92.08 kg (203 lb)  BMI 31.79 kg/m2  LMP 12/07/2014 (LMP Unknown)      FHT:  FHR: 115 bpm, variability: minimal ,  accelerations:  Present,  decelerations:  Absent UC:   regular, every 2 minutes on 373mu/min of pitocin SVE:   3.5/80/-2. AROM clear fluid- IUPC placed Labs: Lab Results  Component Value Date   WBC 7.9 09/23/2015   HGB 12.2 09/23/2015   HCT 36.7 09/23/2015   MCV 84.4 09/23/2015   PLT 205 09/23/2015    Assessment / Plan: Augmentation of labor, progressing well  Labor: Progressing normally Preeclampsia:  labs stable Fetal Wellbeing:  Category II Pain Control:  Labor support without medications I/D:  n/a Anticipated MOD:  NSVD  Cathyann Kilfoyle Burr 09/24/2015, 12:48 PM

## 2015-09-24 NOTE — Progress Notes (Signed)
Mariea Clontsngel Digiulio is a 19 y.o. G1P0 at 4844w4d by LMP admitted for induction of labor due to Post dates. Due date 09/13/15.  Subjective: Reports lower back pain with contractions, did rest some last night  Objective: BP 118/81 mmHg  Pulse 71  Temp(Src) 98 F (36.7 C) (Oral)  Resp 16  Ht 5\' 7"  (1.702 m)  Wt 92.08 kg (203 lb)  BMI 31.79 kg/m2  LMP 12/07/2014 (LMP Unknown)      FHT:  FHR: 115 bpm, variability: minimal ,  accelerations:  Present,  decelerations:  Absent UC:   regular, every 5-7 minutes SVE:   3/75/-2 per myself, 3rd dose cytotec placed at 0530a Labs: Lab Results  Component Value Date   WBC 7.9 09/23/2015   HGB 12.2 09/23/2015   HCT 36.7 09/23/2015   MCV 84.4 09/23/2015   PLT 205 09/23/2015    Assessment / Plan: Induction of labor due to postterm,  progressing well on pitocin  Labor: Progressing normally Preeclampsia:  labs stable Fetal Wellbeing:  Category III Pain Control:  Labor support without medications I/D:  n/a Anticipated MOD:  NSVD  Melody Burr 09/24/2015, 7:29 AM

## 2015-09-24 NOTE — Progress Notes (Signed)
Alexandra PetitM Burr CNm given update. Also informed that pt not recieiving relief from epidural or epidural measures ordered by Dr Henrene HawkingKephart. Pt aware Dr Henrene HawkingKephart willing to restart a ssecond epidural but pt declining. Pt very distressed with labor and exhaustion. Order received for Iv pain management

## 2015-09-25 DIAGNOSIS — Z3403 Encounter for supervision of normal first pregnancy, third trimester: Secondary | ICD-10-CM

## 2015-09-25 LAB — CBC
HCT: 29.7 % — ABNORMAL LOW (ref 35.0–47.0)
HEMOGLOBIN: 9.9 g/dL — AB (ref 12.0–16.0)
MCH: 27.7 pg (ref 26.0–34.0)
MCHC: 33.2 g/dL (ref 32.0–36.0)
MCV: 83.4 fL (ref 80.0–100.0)
Platelets: 166 10*3/uL (ref 150–440)
RBC: 3.56 MIL/uL — AB (ref 3.80–5.20)
RDW: 13.1 % (ref 11.5–14.5)
WBC: 12 10*3/uL — AB (ref 3.6–11.0)

## 2015-09-25 LAB — RPR: RPR Ser Ql: NONREACTIVE

## 2015-09-25 MED ORDER — ACETAMINOPHEN 325 MG PO TABS
650.0000 mg | ORAL_TABLET | ORAL | Status: DC | PRN
Start: 2015-09-25 — End: 2015-09-27

## 2015-09-25 MED ORDER — WITCH HAZEL-GLYCERIN EX PADS: 1.0000 "application " | MEDICATED_PAD | CUTANEOUS | Status: DC | PRN

## 2015-09-25 MED ORDER — OXYCODONE-ACETAMINOPHEN 5-325 MG PO TABS: 1.0000 | ORAL_TABLET | ORAL | Status: DC | PRN

## 2015-09-25 MED ORDER — SENNOSIDES-DOCUSATE SODIUM 8.6-50 MG PO TABS
2.0000 | ORAL_TABLET | ORAL | Status: DC
  Administered 2015-09-26: 2 via ORAL
  Filled 2015-09-25: qty 2

## 2015-09-25 MED ORDER — BENZOCAINE-MENTHOL 20-0.5 % EX AERO
1.0000 "application " | INHALATION_SPRAY | CUTANEOUS | Status: DC | PRN
  Administered 2015-09-25: 1 via TOPICAL
  Filled 2015-09-25: qty 56

## 2015-09-25 MED ORDER — FERROUS SULFATE 325 (65 FE) MG PO TABS
325.0000 mg | ORAL_TABLET | Freq: Three times a day (TID) | ORAL | Status: DC
  Administered 2015-09-25 – 2015-09-27 (×5): 325 mg via ORAL
  Filled 2015-09-25 (×5): qty 1

## 2015-09-25 MED ORDER — IBUPROFEN 600 MG PO TABS
600.0000 mg | ORAL_TABLET | Freq: Four times a day (QID) | ORAL | Status: DC
  Administered 2015-09-25 – 2015-09-27 (×5): 600 mg via ORAL
  Filled 2015-09-25 (×4): qty 1

## 2015-09-25 MED ORDER — ONDANSETRON HCL 4 MG PO TABS: 4.0000 mg | ORAL_TABLET | ORAL | Status: DC | PRN

## 2015-09-25 MED ORDER — OXYCODONE-ACETAMINOPHEN 5-325 MG PO TABS: 2.0000 | ORAL_TABLET | ORAL | Status: DC | PRN

## 2015-09-25 MED ORDER — DIBUCAINE 1 % RE OINT
1.0000 | TOPICAL_OINTMENT | RECTAL | Status: DC | PRN
Start: 2015-09-25 — End: 2015-09-27

## 2015-09-25 MED ORDER — IBUPROFEN 600 MG PO TABS
ORAL_TABLET | ORAL | Status: AC
  Administered 2015-09-25: 600 mg
  Filled 2015-09-25: qty 1

## 2015-09-25 MED ORDER — OXYTOCIN 40 UNITS IN LACTATED RINGERS INFUSION - SIMPLE MED
INTRAVENOUS | Status: AC
  Filled 2015-09-25: qty 1000

## 2015-09-25 MED ORDER — DIPHENHYDRAMINE HCL 25 MG PO CAPS: 25.0000 mg | ORAL_CAPSULE | Freq: Four times a day (QID) | ORAL | Status: DC | PRN

## 2015-09-25 MED ORDER — ZOLPIDEM TARTRATE 5 MG PO TABS: 5.0000 mg | ORAL_TABLET | Freq: Every evening | ORAL | Status: DC | PRN

## 2015-09-25 MED ORDER — BUTORPHANOL TARTRATE 1 MG/ML IJ SOLN
2.0000 mg | Freq: Once | INTRAMUSCULAR | Status: AC
  Administered 2015-09-25: 2 mg via INTRAVENOUS
  Filled 2015-09-25: qty 2

## 2015-09-25 MED ORDER — PRENATAL MULTIVITAMIN CH
1.0000 | ORAL_TABLET | Freq: Every day | ORAL | Status: DC
  Administered 2015-09-25 – 2015-09-27 (×3): 1 via ORAL
  Filled 2015-09-25 (×3): qty 1

## 2015-09-25 MED ORDER — ONDANSETRON HCL 4 MG/2ML IJ SOLN: 4.0000 mg | INTRAMUSCULAR | Status: DC | PRN

## 2015-09-25 MED ORDER — LANOLIN HYDROUS EX OINT: TOPICAL_OINTMENT | CUTANEOUS | Status: DC | PRN

## 2015-09-25 MED ORDER — SIMETHICONE 80 MG PO CHEW
80.0000 mg | CHEWABLE_TABLET | ORAL | Status: DC | PRN
Start: 2015-09-25 — End: 2015-09-27

## 2015-09-25 NOTE — Progress Notes (Signed)
Post Partum Day 0 Subjective: up ad lib and voiding  Objective: Blood pressure 125/79, pulse 73, temperature 98.9 F (37.2 C), temperature source Oral, resp. rate 20, height 5\' 7"  (1.702 m), weight 203 lb (92.08 kg), last menstrual period 12/07/2014, SpO2 99 %, unknown if currently breastfeeding.  Physical Exam:  General: alert, cooperative, appears stated age and fatigued Lochia: appropriate Uterine Fundus: firm Incision: NA DVT Evaluation: No evidence of DVT seen on physical exam. Negative Homan's sign.   Recent Labs  09/23/15 1939 09/25/15 0855  HGB 12.2 9.9*  HCT 36.7 29.7*    Assessment/Plan: Plan for discharge tomorrow, Breastfeeding and Circumcision prior to discharge Infant feeding only Breast Anemic- added ferrous 325mg  tid    LOS: 2 days   Melody Burr 09/25/2015, 12:33 PM

## 2015-09-25 NOTE — Anesthesia Postprocedure Evaluation (Signed)
  Anesthesia Post-op Note  Patient: Alexandra Mullins  Procedure(s) Performed: * No procedures listed *  Anesthesia type:Epidural  Patient location: PACU  Post pain: Pain level controlled  Post assessment: Post-op Vital signs reviewed, Patient's Cardiovascular Status Stable, Respiratory Function Stable, Patent Airway and No signs of Nausea or vomiting  Post vital signs: Reviewed and stable  Last Vitals:  Filed Vitals:   09/25/15 0610  BP: 134/87  Pulse: 78  Temp:   Resp: 20    Level of consciousness: awake, alert  and patient cooperative  Complications: No apparent anesthesia complications

## 2015-09-25 NOTE — Anesthesia Postprocedure Evaluation (Incomplete)
  Anesthesia Post-op Note  Patient: Alexandra Mullins  Procedure(s) Performed: * No procedures listed *  Anesthesia type:Epidural  Patient location: PACU  Post pain: Pain level controlled  Post assessment: Post-op Vital signs reviewed, Patient's Cardiovascular Status Stable, Respiratory Function Stable, Patent Airway and No signs of Nausea or vomiting  Post vital signs: Reviewed and stable  Last Vitals:  Filed Vitals:   09/25/15 0610  BP: 134/87  Pulse: 78  Temp:   Resp: 20    Level of consciousness: awake, alert  and patient cooperative  Complications: No apparent anesthesia complications  

## 2015-09-25 NOTE — Progress Notes (Signed)
C Elder CNM texting Donnetta HailM Burr CNM with report. Pt taught how to self massage uterus, encouraged to take some po pain med with a little food and then void.  Pt agreeable

## 2015-09-25 NOTE — Progress Notes (Signed)
Off going RN stating that CNM Alexandra Mullins aware of uncoordinated u/c pattern and requesting [facility] through such in effort to achieve a better pattern

## 2015-09-26 MED ORDER — VITAMIN D 50 MCG (2000 UT) PO TABS: 2000.0000 [IU] | ORAL_TABLET | Freq: Every day | ORAL | Status: DC

## 2015-09-26 MED ORDER — FUSION PLUS PO CAPS: 1.0000 | ORAL_CAPSULE | Freq: Every day | ORAL | Status: DC

## 2015-09-26 NOTE — Discharge Summary (Signed)
Obstetric Discharge Summary Reason for Admission: induction of labor Prenatal Procedures: NST and ultrasound Intrapartum Procedures: spontaneous vaginal delivery Postpartum Procedures: none Complications-Operative and Postpartum: anemia HEMOGLOBIN  Date Value Ref Range Status  09/25/2015 9.9* 12.0 - 16.0 g/dL Final  09/60/454003/07/2015 98.111.8 g/dL Final   HCT  Date Value Ref Range Status  09/25/2015 29.7* 35.0 - 47.0 % Final  02/18/2015 36 % Final   HEMATOCRIT  Date Value Ref Range Status  07/24/2015 32.8* 34.0 - 46.6 % Final    Physical Exam:  General: alert, cooperative, appears stated age and pale Lochia: appropriate Uterine Fundus: firm Incision: NA DVT Evaluation: No evidence of DVT seen on physical exam. Negative Homan's sign.  Discharge Diagnoses: Post-date pregnancy and svd  Discharge Information: Date: 09/26/2015 Activity: pelvic rest Diet: routine Medications: PNV, Colace, Iron and plans nexplanon PP Condition: stable Instructions: refer to practice specific booklet Discharge to: home   Newborn Data: Live born female 'Kayden" place post-discharge circumcision Birth Weight: 7 lb 12.3 oz (3525 g) APGAR: 8, 9  Home with mother.  Willadene Mounsey Burr 09/26/2015, 12:51 PM

## 2015-09-26 NOTE — Lactation Note (Signed)
This note was copied from the chart of Alexandra Mullins. Lactation Consultation Note  Patient Name: Alexandra Mariea Clontsngel Beitler UXLKG'MToday's Date: 09/26/2015     Maternal Data  MOM states baby breastfeeding better today, no troubles latching, some nipple tenderness, lanolin given with instruction in use, possible discharge tonight Fe[facility]    [facility]                      Lactation Tools Discussed/Used     Consult Status      Dyann KiefMarsha D Shatana Saxton 09/26/2015, 6:30 PM

## 2015-09-27 NOTE — Discharge Instructions (Signed)
Call your doctor for increased pain or vaginal bleeding, temperature above 100.4, depression, or concerns.  No strenuous activity or heavy lifting for 6 weeks.  No intercourse, tampons, douching, or enemas for 6 weeks.  No tub baths-showers only.  No driving for 2 weeks or while taking pain medications.  Continue prenatal vitamin and iron.  Increase calories and fluids while breastfeeding. °

## 2015-09-27 NOTE — Progress Notes (Signed)
Discharged inst reviewed Pt v/o full understanding of all inst. Discharged home via w/c Hazle QuantElaine J Jenice Leiner, RN 09/27/2015 8:51 PM

## 2015-10-30 ENCOUNTER — Encounter: Payer: Self-pay | Admitting: Obstetrics and Gynecology

## 2015-10-30 ENCOUNTER — Ambulatory Visit (INDEPENDENT_AMBULATORY_CARE_PROVIDER_SITE_OTHER): Payer: Medicaid Other | Admitting: Obstetrics and Gynecology

## 2015-10-30 VITALS — BP 113/72 | HR 99 | Ht 67.0 in | Wt 186.8 lb

## 2015-10-30 DIAGNOSIS — Z30018 Encounter for initial prescription of other contraceptives: Secondary | ICD-10-CM

## 2015-10-30 DIAGNOSIS — Z30017 Encounter for initial prescription of implantable subdermal contraceptive: Secondary | ICD-10-CM

## 2015-10-30 LAB — POCT URINE PREGNANCY: PREG TEST UR: NEGATIVE

## 2015-10-30 MED ORDER — ETONOGESTREL 68 MG ~~LOC~~ IMPL: 1.0000 | DRUG_IMPLANT | Freq: Once | SUBCUTANEOUS | Status: AC

## 2015-10-30 NOTE — Progress Notes (Signed)
Alexandra Mullins is a 19 y.o. year old African American female here for Nexplanon insertion.  No LMP recorded., last sexual intercourse was last week, and her pregnancy test today was negative.  Risks/benefits/side effects of Nexplanon have been discussed and her questions have been answered.  Specifically, a failure rate of 12/998 has been reported, with an increased failure rate if pt takes St. John's Wort and/or antiseizure medicaitons.  Alexandra Mullins is aware of the common side effect of irregular bleeding, which the incidence of decreases over time.  BP 113/72 mmHg  Pulse 99  Ht 5\' 7"  (1.702 m)  Wt 186 lb 12.8 oz (84.732 kg)  BMI 29.25 kg/m2  Breastfeeding? No  Results for orders placed or performed in visit on 10/30/15 (from the past 24 hour(s))  POCT urine pregnancy   Collection Time: 10/30/15 11:16 AM  Result Value Ref Range   Preg Test, Ur Negative Negative     She is right-handed, so her left arm, approximately 4 inches proximal from the elbow, was cleansed with alcohol and anesthetized with 2cc of 2% Lidocaine.  The area was cleansed again with betadine and the Nexplanon was inserted per manufacturer's recommendations without difficulty.  A steri-strip and pressure bandage were applied.  Pt was instructed to keep the area clean and dry, remove pressure bandage in 24 hours, and keep insertion site covered with the steri-strip for 3-5 days.  Back up contraception was recommended for 2 weeks.  She was given a card indicating date Nexplanon was inserted and date it needs to be removed. Follow-up PRN problems.  Melody Burr,CNM

## 2015-10-30 NOTE — Patient Instructions (Signed)
Etonogestrel implant What is this medicine? ETONOGESTREL (et oh noe JES trel) is a contraceptive (birth control) device. It is used to prevent pregnancy. It can be used for up to 3 years. This medicine may be used for other purposes; ask your health care provider or pharmacist if you have questions. What should I tell my health care provider before I take this medicine? They need to know if you have any of these conditions: -abnormal vaginal bleeding -blood vessel disease or blood clots -cancer of the breast, cervix, or liver -depression -diabetes -gallbladder disease -headaches -heart disease or recent heart attack -high blood pressure -high cholesterol -kidney disease -liver disease -renal disease -seizures -tobacco smoker -an unusual or allergic reaction to etonogestrel, other hormones, anesthetics or antiseptics, medicines, foods, dyes, or preservatives -pregnant or trying to get pregnant -breast-feeding How should I use this medicine? This device is inserted just under the skin on the inner side of your upper arm by a health care professional. Talk to your pediatrician regarding the use of this medicine in children. Special care may be needed. Overdosage: If you think you have taken too much of this medicine contact a poison control center or emergency room at once. NOTE: This medicine is only for you. Do not share this medicine with others. What if I miss a dose? This does not apply. What may interact with this medicine? Do not take this medicine with any of the following medications: -amprenavir -bosentan -fosamprenavir This medicine may also interact with the following medications: -barbiturate medicines for inducing sleep or treating seizures -certain medicines for fungal infections like ketoconazole and itraconazole -griseofulvin -medicines to treat seizures like carbamazepine, felbamate, oxcarbazepine, phenytoin,  topiramate -modafinil -phenylbutazone -rifampin -some medicines to treat HIV infection like atazanavir, indinavir, lopinavir, nelfinavir, tipranavir, ritonavir -St. John's wort This list may not describe all possible interactions. Give your health care provider a list of all the medicines, herbs, non-prescription drugs, or dietary supplements you use. Also tell them if you smoke, drink alcohol, or use illegal drugs. Some items may interact with your medicine. What should I watch for while using this medicine? This product does not protect you against HIV infection (AIDS) or other sexually transmitted diseases. You should be able to feel the implant by pressing your fingertips over the skin where it was inserted. Contact your doctor if you cannot feel the implant, and use a non-hormonal birth control method (such as condoms) until your doctor confirms that the implant is in place. If you feel that the implant may have broken or become bent while in your arm, contact your healthcare provider. What side effects may I notice from receiving this medicine? Side effects that you should report to your doctor or health care professional as soon as possible: -allergic reactions like skin rash, itching or hives, swelling of the face, lips, or tongue -breast lumps -changes in emotions or moods -depressed mood -heavy or prolonged menstrual bleeding -pain, irritation, swelling, or bruising at the insertion site -scar at site of insertion -signs of infection at the insertion site such as fever, and skin redness, pain or discharge -signs of pregnancy -signs and symptoms of a blood clot such as breathing problems; changes in vision; chest pain; severe, sudden headache; pain, swelling, warmth in the leg; trouble speaking; sudden numbness or weakness of the face, arm or leg -signs and symptoms of liver injury like dark yellow or brown urine; general ill feeling or flu-like symptoms; light-colored stools; loss of  appetite; nausea; right upper belly   pain; unusually weak or tired; yellowing of the eyes or skin -unusual vaginal bleeding, discharge -signs and symptoms of a stroke like changes in vision; confusion; trouble speaking or understanding; severe headaches; sudden numbness or weakness of the face, arm or leg; trouble walking; dizziness; loss of balance or coordination Side effects that usually do not require medical attention (Report these to your doctor or health care professional if they continue or are bothersome.): -acne -back pain -breast pain -changes in weight -dizziness -general ill feeling or flu-like symptoms -headache -irregular menstrual bleeding -nausea -sore throat -vaginal irritation or inflammation This list may not describe all possible side effects. Call your doctor for medical advice about side effects. You may report side effects to FDA at 1-800-FDA-1088. Where should I keep my medicine? This drug is given in a hospital or clinic and will not be stored at home. NOTE: This sheet is a summary. It may not cover all possible information. If you have questions about this medicine, talk to your doctor, pharmacist, or health care provider.    2016, Elsevier/Gold Standard. (2014-09-13 14:07:06)  

## 2015-11-05 ENCOUNTER — Ambulatory Visit: Payer: Medicaid Other | Admitting: Obstetrics and Gynecology

## 2015-11-13 ENCOUNTER — Ambulatory Visit (INDEPENDENT_AMBULATORY_CARE_PROVIDER_SITE_OTHER): Payer: Medicaid Other | Admitting: Obstetrics and Gynecology

## 2015-11-13 ENCOUNTER — Encounter: Payer: Self-pay | Admitting: Obstetrics and Gynecology

## 2015-11-13 NOTE — Patient Instructions (Signed)
  Place postpartum visit patient instructions here.  

## 2015-11-13 NOTE — Progress Notes (Signed)
  Subjective:     Alexandra Clontsngel Mullins is a 19 y.o. female who presents for a postpartum visit. She is 6 weeks postpartum following a spontaneous vaginal delivery. I have fully reviewed the prenatal and intrapartum course. The delivery was at 39 gestational weeks. Outcome: spontaneous vaginal delivery. Anesthesia: none. Postpartum course has been uneventful. Baby's course has been uneventful. Baby is feeding by formula. Bleeding moderate lochia. Bowel function is normal. Bladder function is normal. Patient is sexually active. Contraception method is Nexplanon. Postpartum depression screening: negative.  The following portions of the patient's history were reviewed and updated as appropriate: allergies, current medications, past family history, past medical history, past social history, past surgical history and problem list.  Review of Systems A comprehensive review of systems was negative.   Objective:    BP 109/81 mmHg  Pulse 107  Ht 5\' 7"  (1.702 m)  Wt 189 lb 14.4 oz (86.138 kg)  BMI 29.74 kg/m2  LMP 10/30/2015 (Exact Date)  Breastfeeding? No  General:  alert, cooperative and appears stated age   Breasts:  inspection negative, no nipple discharge or bleeding, no masses or nodularity palpable  Lungs: clear to auscultation bilaterally  Heart:  regular rate and rhythm, S1, S2 normal, no murmur, click, rub or gallop  Abdomen: soft, non-tender; bowel sounds normal; no masses,  no organomegaly   Vulva:  normal  Vagina: normal vagina  Cervix:  multiparous appearance  Corpus: normal size, contour, position, consistency, mobility, non-tender  Adnexa:  normal adnexa  Rectal Exam: Not performed.       Thyroid slightly enlarged Assessment:     6 weeks postpartum exam. Pap smear not done at today's visit.   Plan:    1. Contraception: Nexplanon 2. rechak labs for anemia and thyroid function 3. Follow up in: 3 months or as needed.

## 2015-11-14 ENCOUNTER — Other Ambulatory Visit: Payer: Self-pay | Admitting: Obstetrics and Gynecology

## 2015-11-14 DIAGNOSIS — E559 Vitamin D deficiency, unspecified: Secondary | ICD-10-CM

## 2015-11-14 LAB — CBC
HEMATOCRIT: 40.1 % (ref 34.0–46.6)
Hemoglobin: 13 g/dL (ref 11.1–15.9)
MCH: 26.9 pg (ref 26.6–33.0)
MCHC: 32.4 g/dL (ref 31.5–35.7)
MCV: 83 fL (ref 79–97)
PLATELETS: 356 10*3/uL (ref 150–379)
RBC: 4.84 x10E6/uL (ref 3.77–5.28)
RDW: 14.7 % (ref 12.3–15.4)
WBC: 7.6 10*3/uL (ref 3.4–10.8)

## 2015-11-14 LAB — THYROID PANEL WITH TSH
FREE THYROXINE INDEX: 2.2 (ref 1.2–4.9)
T3 UPTAKE RATIO: 27 % (ref 24–39)
T4 TOTAL: 8.2 ug/dL (ref 4.5–12.0)
TSH: 0.934 u[IU]/mL (ref 0.450–4.500)

## 2015-11-14 LAB — VITAMIN D 25 HYDROXY (VIT D DEFICIENCY, FRACTURES): Vit D, 25-Hydroxy: 16.3 ng/mL — ABNORMAL LOW (ref 30.0–100.0)

## 2015-11-14 LAB — IRON: Iron: 56 ug/dL (ref 27–159)

## 2015-11-14 MED ORDER — VITAMIN D (ERGOCALCIFEROL) 1.25 MG (50000 UNIT) PO CAPS: 50000.0000 [IU] | ORAL_CAPSULE | ORAL | Status: DC

## 2015-12-01 ENCOUNTER — Telehealth: Payer: Self-pay | Admitting: *Deleted

## 2015-12-01 NOTE — Telephone Encounter (Signed)
-----   Message from Purcell NailsMelody N Shambley, [place] sent at 11/14/2015 12:28 PM EST ----- Please let her know iron is good but vit D is really low- and send info on vit D def.  i sent in rx for 2x week supplement, and want hr to come by in Feb to have levels rechecked.

## 2015-12-01 NOTE — Telephone Encounter (Signed)
Notified pt. 

## 2017-04-23 ENCOUNTER — Encounter: Payer: Self-pay | Admitting: Emergency Medicine

## 2017-04-23 ENCOUNTER — Emergency Department
Admission: EM | Admit: 2017-04-23 | Discharge: 2017-04-23 | Disposition: A | Discharge status: Home / Self Care | Payer: Medicaid Other | Attending: Emergency Medicine | Admitting: Emergency Medicine

## 2017-04-23 DIAGNOSIS — J029 Acute pharyngitis, unspecified: Secondary | ICD-10-CM | POA: Diagnosis present

## 2017-04-23 DIAGNOSIS — F17210 Nicotine dependence, cigarettes, uncomplicated: Secondary | ICD-10-CM | POA: Diagnosis not present

## 2017-04-23 LAB — POCT RAPID STREP A: Streptococcus, Group A Screen (Direct): NEGATIVE

## 2017-04-23 MED ORDER — AMOXICILLIN 500 MG PO CAPS: 500.0000 mg | ORAL_CAPSULE | Freq: Two times a day (BID) | ORAL | 0 refills | Status: AC

## 2017-04-23 MED ORDER — LIDOCAINE VISCOUS 2 % MT SOLN: 10.0000 mL | OROMUCOSAL | 0 refills | Status: DC | PRN

## 2017-04-23 NOTE — ED Triage Notes (Signed)
Sore throat x 2 days

## 2017-04-23 NOTE — ED Provider Notes (Signed)
[facility] Emergency Department Provider Note  ____________________________________________  Time seen: Approximately 7:51 AM  I have reviewed the triage vital signs and the nursing notes.   HISTORY  Chief Complaint Sore Throat    HPI Alexandra Mullins is a 21 y.o. female that presents to the emergency department with sore throat for 2 days. She is unsure if she has had a fever. She is eating and drinking normally. She was exposed to strep throat this week. She denies headache, congestion, cough, shortness of breath, chest pain, nausea, vomiting, abdominal pain.   Past Medical History:  Diagnosis Date  . Medical history non-contributory     Patient Active Problem List   Diagnosis Date Noted  . Labor and delivery, indication for care 09/23/2015    History reviewed. No pertinent surgical history.  Prior to Admission medications   Medication Sig Start Date End Date Taking? Authorizing Provider  amoxicillin (AMOXIL) 500 MG capsule Take 1 capsule (500 mg total) by mouth 2 (two) times daily. 04/23/17 05/03/17  Enid Derry, PA-C  etonogestrel (NEXPLANON) 68 MG IMPL implant 1 each (68 mg total) by Subdermal route once. 10/30/15   Shambley, Melody N, CNM  lidocaine (XYLOCAINE) 2 % solution Use as directed 10 mLs in the mouth or throat as needed for mouth pain. 04/23/17   Enid Derry, PA-C  Prenatal Vit-Fe Fumarate-FA (PRENATAL MULTIVITAMIN) TABS tablet Take 1 tablet by mouth daily at 12 noon.    [provider]  Vitamin D, Ergocalciferol, (DRISDOL) 50000 UNITS CAPS capsule Take 1 capsule (50,000 Units total) by mouth 2 (two) times a week. 11/14/15   Purcell Nails, CNM    Allergies Patient has no known allergies.  No family history on file.  Social History Social History  Substance Use Topics  . Smoking status: Current Every Day Smoker    Types: Cigars    Last attempt to quit: 01/23/2015  . Smokeless tobacco: Never Used     Comment: one  black and mild per day  . Alcohol use No     Review of Systems  Eyes: No visual changes. No discharge. ENT: Negative for congestion and rhinorrhea. Cardiovascular: No chest pain. Respiratory: Negative for cough. No SOB. Gastrointestinal: No abdominal pain.  No nausea, no vomiting.   Musculoskeletal: Negative for musculoskeletal pain. Skin: Negative for rash, abrasions, lacerations, ecchymosis. Neurological: Negative for headaches.   ____________________________________________   PHYSICAL EXAM:  VITAL SIGNS: ED Triage Vitals  Enc Vitals Group     BP 04/23/17 0708 128/76     Pulse Rate 04/23/17 0708 94     Resp 04/23/17 0708 18     Temp 04/23/17 0708 99.3 F (37.4 C)     Temp Source 04/23/17 0708 Oral     SpO2 04/23/17 0708 98 %     Weight 04/23/17 0709 170 lb (77.1 kg)     Height 04/23/17 0709 5\' 7"  (1.702 m)     Head Circumference --      Peak Flow --      Pain Score 04/23/17 0708 9     Pain Loc --      Pain Edu? --      Excl. in GC? --      Constitutional: Alert and oriented. Well appearing and in no acute distress. Eyes: Conjunctivae are normal. PERRL. EOMI. No discharge. Head: Atraumatic. ENT: No frontal and maxillary sinus tenderness.      Ears: Tympanic membranes pearly gray with good landmarks. No discharge.  Nose: No congestion/rhinnorhea.      Mouth/Throat: Mucous membranes are moist. Oropharynx erythematous. Tonsils enlarged bilaterally. No exudates. Uvula midline. Neck: No stridor.   Hematological/Lymphatic/Immunilogical: Anterior cervical lymphadenopathy. Cardiovascular: Normal rate, regular rhythm.  Good peripheral circulation. Respiratory: Normal respiratory effort without tachypnea or retractions. Lungs CTAB. Good air entry to the bases with no decreased or absent breath sounds. Gastrointestinal: Bowel sounds 4 quadrants. Soft and nontender to palpation. No guarding or rigidity. No palpable masses. No distention. Musculoskeletal: Full range of  motion to all extremities. No gross deformities appreciated. Neurologic:  Normal speech and language. No gross focal neurologic deficits are appreciated.  Skin:  Skin is warm, dry and intact. No rash noted.   ____________________________________________   LABS (all labs ordered are listed, but only abnormal results are displayed)  Labs Reviewed  CULTURE, GROUP A STREP [facility])  POCT RAPID STREP A   ____________________________________________  EKG   ____________________________________________  RADIOLOGY   No results found.  ____________________________________________    PROCEDURES  Procedure(s) performed:    Procedures    Medications - No data to display   ____________________________________________   INITIAL IMPRESSION / ASSESSMENT AND PLAN / ED COURSE  Pertinent labs & imaging results that were available during my care of the patient were reviewed by me and considered in my medical decision making (see chart for details).  Review of the [place] CSRS was performed in accordance of the [facility] prior to dispensing any controlled drugs.     Patient's diagnosis is consistent with pharyngitis. Vital signs and exam are reassuring. Strep test negative. This will be sent for culture. She was exposed to strep this week so I will write prescription for amoxicillin and patient was told that her strep test will be sent for culture. Patient appears well and is staying well hydrated. Patient feels comfortable going home. Patient will be discharged home with prescriptions for amoxicillin and viscous lidocaine. Patient is to follow up with PCP as needed or otherwise directed. Patient is given ED precautions to return to the ED for any worsening or new symptoms.     ____________________________________________  FINAL CLINICAL IMPRESSION(S) / ED DIAGNOSES  Final diagnoses:  Pharyngitis, unspecified etiology      NEW MEDICATIONS STARTED DURING THIS VISIT:  Discharge  Medication List as of 04/23/2017  8:06 AM    START taking these medications   Details  amoxicillin (AMOXIL) 500 MG capsule Take 1 capsule (500 mg total) by mouth 2 (two) times daily., Starting Sat 04/23/2017, Until Tue 05/03/2017, Print    lidocaine (XYLOCAINE) 2 % solution Use as directed 10 mLs in the mouth or throat as needed for mouth pain., Starting Sat 04/23/2017, Print            This chart was dictated using voice recognition software/Dragon. Despite best efforts to proofread, errors can occur which can change the meaning. Any change was purely unintentional.    Enid DerryWagner, Audyn Dimercurio, PA-C 04/23/17 [id number]    Sharyn CreamerQuale, Mark, MD 04/23/17 1323

## 2017-04-24 LAB — CULTURE, GROUP A STREP (THRC)

## 2017-09-08 ENCOUNTER — Ambulatory Visit (INDEPENDENT_AMBULATORY_CARE_PROVIDER_SITE_OTHER): Payer: Medicaid Other | Admitting: Obstetrics and Gynecology

## 2017-09-08 ENCOUNTER — Encounter: Payer: Self-pay | Admitting: Obstetrics and Gynecology

## 2017-09-08 VITALS — BP 120/77 | HR 87 | Ht 67.0 in | Wt 168.9 lb

## 2017-09-08 DIAGNOSIS — N921 Excessive and frequent menstruation with irregular cycle: Secondary | ICD-10-CM | POA: Diagnosis not present

## 2017-09-08 DIAGNOSIS — Z978 Presence of other specified devices: Secondary | ICD-10-CM

## 2017-09-08 DIAGNOSIS — Z975 Presence of (intrauterine) contraceptive device: Principal | ICD-10-CM

## 2017-09-08 NOTE — Progress Notes (Signed)
Subjective:     Patient ID: Alexandra Mullins, female   DOB: 10-25-1996, 21 y.o.   MRN: [id number]  HPI Reports prolonged and irregular menses since Nexplanon placed 20 months ago (postpartum). Reports menses lasting 2-3 weeks with mild cramps and sometimes heavy bleeding. inbetween bleeding she has increased white discharge with a fishy odor. Denies pain with sex or bleeding afterwards, no new sexual partner. Some episoides of feeling tired and lightheaded when on menses. Working PT at [employer] in [place].   Review of Systems Negative except stated above in HPI    Objective:   Physical Exam A&Ox4 Well groomed female in no distress Blood pressure 120/77, pulse 87, height '5\' 7"'  (1.702 m), weight 168 lb 14.4 oz (76.6 kg), last menstrual period 09/06/2017, not currently breastfeeding. HRR Thyroid not enlarged Pelvic exam declined due to heavy menses.    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 Labs obtained- will follow up accordingly.  1 pack Taytulla given to take in mean time.  >50% of 15 minute visit spent in counseling. Melody Bridger, CNM

## 2017-09-09 LAB — B12 AND FOLATE PANEL
Folate: 2.1 ng/mL — ABNORMAL LOW (ref >3.0)
VITAMIN B 12: 1124 pg/mL (ref 232–1245)

## 2017-09-09 LAB — CBC
HEMOGLOBIN: 14.7 g/dL (ref 11.1–15.9)
Hematocrit: 44.3 % (ref 34.0–46.6)
MCH: 29.7 pg (ref 26.6–33.0)
MCHC: 33.2 g/dL (ref 31.5–35.7)
MCV: 90 fL (ref 79–97)
Platelets: 221 10*3/uL (ref 150–379)
RBC: 4.95 x10E6/uL (ref 3.77–5.28)
RDW: 13.3 % (ref 12.3–15.4)
WBC: 7.6 10*3/uL (ref 3.4–10.8)

## 2017-09-09 LAB — THYROID PANEL WITH TSH
Free Thyroxine Index: 2 (ref 1.2–4.9)
T3 Uptake Ratio: 27 % (ref 24–39)
T4, Total: 7.4 ug/dL (ref 4.5–12.0)
TSH: 0.674 u[IU]/mL (ref 0.450–4.500)

## 2017-09-09 LAB — FERRITIN: FERRITIN: 28 ng/mL (ref 15–150)

## 2020-02-01 ENCOUNTER — Ambulatory Visit: Payer: Medicaid Other | Admitting: Certified Nurse Midwife

## 2020-02-01 ENCOUNTER — Other Ambulatory Visit: Payer: Self-pay

## 2020-02-01 ENCOUNTER — Encounter: Payer: Self-pay | Admitting: Certified Nurse Midwife

## 2020-02-01 VITALS — BP 121/83 | HR 88 | Ht 67.0 in | Wt 209.6 lb

## 2020-02-01 DIAGNOSIS — Z3049 Encounter for surveillance of other contraceptives: Secondary | ICD-10-CM

## 2020-02-01 DIAGNOSIS — Z30017 Encounter for initial prescription of implantable subdermal contraceptive: Secondary | ICD-10-CM

## 2020-02-01 DIAGNOSIS — Z3202 Encounter for pregnancy test, result negative: Secondary | ICD-10-CM

## 2020-02-01 DIAGNOSIS — Z975 Presence of (intrauterine) contraceptive device: Secondary | ICD-10-CM | POA: Insufficient documentation

## 2020-02-01 LAB — POCT URINE PREGNANCY: Preg Test, Ur: NEGATIVE

## 2020-02-01 NOTE — Progress Notes (Signed)
Alexandra Mullins is a 24 y.o. year old [id number] female here for Nexplanon removal and reinsertion.  She was given informed consent for removal and reinsertion of her Nexplanon. Her Nexplanon was placed 10/30/2015.  Risks/benefits/side effects of Nexplanon have been discussed and her questions have been answered.  Specifically, a failure rate of 12/998 has been reported, with an increased failure rate if pt takes St. John's Wort and/or antiseizure medicaitons.   Macon Lesesne is aware of the common side effect of irregular bleeding, which the incidence of decreases over time.  BP 121/83   Pulse 88   Ht 5\' 7"  (1.702 m)   Wt 209 lb 9.6 oz (95.1 kg)   LMP 01/16/2020 (Approximate)   BMI 32.83 kg/m   Results for orders placed or performed in visit on 02/01/20 (from the past 24 hour(s))  POCT urine pregnancy   Collection Time: 02/01/20  1:53 PM  Result Value Ref Range   Preg Test, Ur Negative Negative     Appropriate time out taken. Nexplanon site identified.  Area prepped in usual sterile fashon. Two cc's of 2% lidocaine was used to anesthetize the area. A small stab incision was made right beside the implant on the distal portion.  The Nexplanon rod was grasped using hemostats and removed intact without difficulty.  The area was cleansed again with betadine and the Nexplanon was inserted per manufacturer's recommendations without difficulty.  Steri-strips and a pressure bandage was applied.  There was less than 3 cc blood loss. There were no complications.  The patient tolerated the procedure well.  She was instructed to keep the area clean and dry, remove pressure bandage in 24 hours, and keep insertion site covered with the steri-strips for 3-5 days.  She was given a card indicating date Nexplanon was inserted and date it needs to be removed.   Reviewed red flag symptoms and when to call.   Encouraged preventive care and pap smear, BCCCP number given.   RTC x 4 months for ANNUAL EXAM and PAP or  sooner if needed.    02/03/20, CNM [facility] 02/01/20 1:50 PM   NDC: 02/03/20 4330 01 Lot: 4332 Exp: 03/2022

## 2020-02-01 NOTE — Patient Instructions (Addendum)
Nexplanon Instructions After Insertion   Keep bandage clean and dry for 24 hours   May use ice/Tylenol/Ibuprofen for soreness or pain   If you develop fever, drainage or increased warmth from incision site-contact office immediately    Etonogestrel implant What is this medicine? ETONOGESTREL (et oh noe JES trel) is a contraceptive (birth control) device. It is used to prevent pregnancy. It can be used for up to 3 years. This medicine may be used for other purposes; ask your health care provider or pharmacist if you have questions. COMMON BRAND NAME(S): Implanon, Nexplanon What should I tell my health care provider before I take this medicine? They need to know if you have any of these conditions:  abnormal vaginal bleeding  blood vessel disease or blood clots  breast, cervical, endometrial, ovarian, liver, or uterine cancer  diabetes  gallbladder disease  heart disease or recent heart attack  high blood pressure  high cholesterol or triglycerides  kidney disease  liver disease  migraine headaches  seizures  stroke  tobacco smoker  an unusual or allergic reaction to etonogestrel, anesthetics or antiseptics, other medicines, foods, dyes, or preservatives  pregnant or trying to get pregnant  breast-feeding How should I use this medicine? This device is inserted just under the skin on the inner side of your upper arm by a health care professional. Talk to your pediatrician regarding the use of this medicine in children. Special care may be needed. Overdosage: If you think you have taken too much of this medicine contact a poison control center or emergency room at once. NOTE: This medicine is only for you. Do not share this medicine with others. What if I miss a dose? This does not apply. What may interact with this medicine? Do not take this medicine with any of the following medications:  amprenavir  fosamprenavir This medicine may also interact with  the following medications:  acitretin  aprepitant  armodafinil  bexarotene  bosentan  carbamazepine  certain medicines for fungal infections like fluconazole, ketoconazole, itraconazole and voriconazole  certain medicines to treat hepatitis, HIV or AIDS  cyclosporine  felbamate  griseofulvin  lamotrigine  modafinil  oxcarbazepine  phenobarbital  phenytoin  primidone  rifabutin  rifampin  rifapentine  St. John's wort  topiramate This list may not describe all possible interactions. Give your health care provider a list of all the medicines, herbs, non-prescription drugs, or dietary supplements you use. Also tell them if you smoke, drink alcohol, or use illegal drugs. Some items may interact with your medicine. What should I watch for while using this medicine? This product does not protect you against HIV infection (AIDS) or other sexually transmitted diseases. You should be able to feel the implant by pressing your fingertips over the skin where it was inserted. Contact your doctor if you cannot feel the implant, and use a non-hormonal birth control method (such as condoms) until your doctor confirms that the implant is in place. Contact your doctor if you think that the implant may have broken or become bent while in your arm. You will receive a user card from your health care provider after the implant is inserted. The card is a record of the location of the implant in your upper arm and when it should be removed. Keep this card with your health records. What side effects may I notice from receiving this medicine? Side effects that you should report to your doctor or health care professional as soon as possible:  allergic reactions   like skin rash, itching or hives, swelling of the face, lips, or tongue  breast lumps, breast tissue changes, or discharge  breathing problems  changes in emotions or moods  coughing up blood  if you feel that the implant  may have broken or bent while in your arm  high blood pressure  pain, irritation, swelling, or bruising at the insertion site  scar at site of insertion  signs of infection at the insertion site such as fever, and skin redness, pain or discharge  signs and symptoms of a blood clot such as breathing problems; changes in vision; chest pain; severe, sudden headache; pain, swelling, warmth in the leg; trouble speaking; sudden numbness or weakness of the face, arm or leg  signs and symptoms of liver injury like dark yellow or brown urine; general ill feeling or flu-like symptoms; light-colored stools; loss of appetite; nausea; right upper belly pain; unusually weak or tired; yellowing of the eyes or skin  unusual vaginal bleeding, discharge Side effects that usually do not require medical attention (report to your doctor or health care professional if they continue or are bothersome):  acne  breast pain or tenderness  headache  irregular menstrual bleeding  nausea This list may not describe all possible side effects. Call your doctor for medical advice about side effects. You may report side effects to FDA at 1-800-FDA-1088. Where should I keep my medicine? This drug is given in a hospital or clinic and will not be stored at home. NOTE: This sheet is a summary. It may not cover all possible information. If you have questions about this medicine, talk to your doctor, pharmacist, or health care provider.  2020 Elsevier/Gold Standard (2019-09-11 11:33:04)   Preventive Care 21-39 Years Old, Female Preventive care refers to visits with your health care provider and lifestyle choices that can promote health and wellness. This includes:  A yearly physical exam. This may also be called an annual well check.  Regular dental visits and eye exams.  Immunizations.  Screening for certain conditions.  Healthy lifestyle choices, such as eating a healthy diet, getting regular exercise, not  using drugs or products that contain nicotine and tobacco, and limiting alcohol use. What can I expect for my preventive care visit? Physical exam Your health care provider will check your:  Height and weight. This may be used to calculate body mass index (BMI), which tells if you are at a healthy weight.  Heart rate and blood pressure.  Skin for abnormal spots. Counseling Your health care provider may ask you questions about your:  Alcohol, tobacco, and drug use.  Emotional well-being.  Home and relationship well-being.  Sexual activity.  Eating habits.  Work and work environment.  Method of birth control.  Menstrual cycle.  Pregnancy history. What immunizations do I need?  Influenza (flu) vaccine  This is recommended every year. Tetanus, diphtheria, and pertussis (Tdap) vaccine  You may need a Td booster every 10 years. Varicella (chickenpox) vaccine  You may need this if you have not been vaccinated. Human papillomavirus (HPV) vaccine  If recommended by your health care provider, you may need three doses over 6 months. Measles, mumps, and rubella (MMR) vaccine  You may need at least one dose of MMR. You may also need a second dose. Meningococcal conjugate (MenACWY) vaccine  One dose is recommended if you are age 19-21 years and a first-year college student living in a residence hall, or if you have one of several medical conditions. You may   also need additional booster doses. Pneumococcal conjugate (PCV13) vaccine  You may need this if you have certain conditions and were not previously vaccinated. Pneumococcal polysaccharide (PPSV23) vaccine  You may need one or two doses if you smoke cigarettes or if you have certain conditions. Hepatitis A vaccine  You may need this if you have certain conditions or if you travel or work in places where you may be exposed to hepatitis A. Hepatitis B vaccine  You may need this if you have certain conditions or if you  travel or work in places where you may be exposed to hepatitis B. Haemophilus influenzae type b (Hib) vaccine  You may need this if you have certain conditions. You may receive vaccines as individual doses or as more than one vaccine together in one shot (combination vaccines). Talk with your health care provider about the risks and benefits of combination vaccines. What tests do I need?  Blood tests  Lipid and cholesterol levels. These may be checked every 5 years starting at age 20.  Hepatitis C test.  Hepatitis B test. Screening  Diabetes screening. This is done by checking your blood sugar (glucose) after you have not eaten for a while (fasting).  Sexually transmitted disease (STD) testing.  BRCA-related cancer screening. This may be done if you have a family history of breast, ovarian, tubal, or peritoneal cancers.  Pelvic exam and Pap test. This may be done every 3 years starting at age 21. Starting at age 30, this may be done every 5 years if you have a Pap test in combination with an HPV test. Talk with your health care provider about your test results, treatment options, and if necessary, the need for more tests. Follow these instructions at home: Eating and drinking   Eat a diet that includes fresh fruits and vegetables, whole grains, lean protein, and low-fat dairy.  Take vitamin and mineral supplements as recommended by your health care provider.  Do not drink alcohol if: ? Your health care provider tells you not to drink. ? You are pregnant, may be pregnant, or are planning to become pregnant.  If you drink alcohol: ? Limit how much you have to 0-1 drink a day. ? Be aware of how much alcohol is in your drink. In the [place] one drink equals one 12 oz bottle of beer (355 mL), one 5 oz glass of wine (148 mL), or one 1 oz glass of hard liquor (44 mL). Lifestyle  Take daily care of your teeth and gums.  Stay active. Exercise for at least 30 minutes on 5 or more days  each week.  Do not use any products that contain nicotine or tobacco, such as cigarettes, e-cigarettes, and chewing tobacco. If you need help quitting, ask your health care provider.  If you are sexually active, practice safe sex. Use a condom or other form of birth control (contraception) in order to prevent pregnancy and STIs (sexually transmitted infections). If you plan to become pregnant, see your health care provider for a preconception visit. What's next?  Visit your health care provider once a year for a well check visit.  Ask your health care provider how often you should have your eyes and teeth checked.  Stay up to date on all vaccines. This information is not intended to replace advice given to you by your health care provider. Make sure you discuss any questions you have with your health care provider. Document Revised: 08/10/2018 Document Reviewed: 08/10/2018 Elsevier Patient Education    Inc.

## 2020-02-01 NOTE — Progress Notes (Signed)
Patient here for Nexplanon removal and placement.  No complaints.

## 2020-02-23 ENCOUNTER — Emergency Department: Payer: Self-pay

## 2020-02-23 ENCOUNTER — Other Ambulatory Visit: Payer: Self-pay

## 2020-02-23 ENCOUNTER — Emergency Department
Admission: EM | Admit: 2020-02-23 | Discharge: 2020-02-23 | Disposition: A | Discharge status: Home / Self Care | Payer: Self-pay | Attending: Emergency Medicine | Admitting: Emergency Medicine

## 2020-02-23 DIAGNOSIS — F17210 Nicotine dependence, cigarettes, uncomplicated: Secondary | ICD-10-CM | POA: Insufficient documentation

## 2020-02-23 DIAGNOSIS — R05 Cough: Secondary | ICD-10-CM | POA: Insufficient documentation

## 2020-02-23 DIAGNOSIS — R059 Cough, unspecified: Secondary | ICD-10-CM

## 2020-02-23 DIAGNOSIS — Z79899 Other long term (current) drug therapy: Secondary | ICD-10-CM | POA: Insufficient documentation

## 2020-02-23 MED ORDER — BENZONATATE 100 MG PO CAPS: ORAL_CAPSULE | ORAL | 0 refills | Status: AC

## 2020-02-23 MED ORDER — BENZONATATE 100 MG PO CAPS: ORAL_CAPSULE | ORAL | 0 refills | Status: DC

## 2020-02-23 MED ORDER — PREDNISONE 20 MG PO TABS
60.0000 mg | ORAL_TABLET | Freq: Once | ORAL | Status: AC
  Administered 2020-02-23: 60 mg via ORAL
  Filled 2020-02-23: qty 3

## 2020-02-23 MED ORDER — PREDNISONE 10 MG (21) PO TBPK: ORAL_TABLET | ORAL | 0 refills | Status: AC

## 2020-02-23 MED ORDER — ALBUTEROL SULFATE HFA 108 (90 BASE) MCG/ACT IN AERS: 2.0000 | INHALATION_SPRAY | Freq: Four times a day (QID) | RESPIRATORY_TRACT | 0 refills | Status: AC | PRN

## 2020-02-23 NOTE — ED Provider Notes (Signed)
[facility] Emergency Department Provider Note ____________________________________________  Time seen: 1330  I have reviewed the triage vital signs and the nursing notes.  HISTORY  Chief Complaint  Cough and Nasal Congestion  HPI Alexandra Mullins is a 24 y.o. female presents her self to the ED for 3 to 4-week complaint of intermittent cough and congestion.  Patient denies any interim fevers, chills, sweats.  She reports mild cough without production, denies any sick contacts, recent travel, or high risk of exposures.  She has a history of seasonal allergies and reports have having had bronchitis in the past but she does also report of an everyday smoker.  Has been taking over-the-counter Robitussin and Mucinex for symptoms without any significant benefit.  She denies any frank chest pain, hemoptysis, or cough induced vomiting.   History reviewed. No pertinent past medical history.  Patient Active Problem List   Diagnosis Date Noted  . Nexplanon in place 02/01/2020    History reviewed. No pertinent surgical history.  Prior to Admission medications   Medication Sig Start Date End Date Taking? Authorizing Provider  albuterol (VENTOLIN HFA) 108 (90 Base) MCG/ACT inhaler Inhale 2 puffs into the lungs every 6 (six) hours as needed. 02/23/20   Lester Platas, Dannielle Karvonen, PA-C  benzonatate (TESSALON PERLES) 100 MG capsule Take 1-2 tabs TID prn cough 02/23/20   Colbie Danner, Dannielle Karvonen, PA-C  etonogestrel (NEXPLANON) 68 MG IMPL implant 1 each (68 mg total) by Subdermal route once. 10/30/15   Shambley, Melody N, CNM  predniSONE (STERAPRED UNI-PAK 21 TAB) 10 MG (21) TBPK tablet 6-day taper as directed. 02/23/20   Onetha Gaffey, Dannielle Karvonen, PA-C    Allergies Patient has no known allergies.  Family History  Problem Relation Age of Onset  . Breast cancer Neg Hx   . Ovarian cancer Neg Hx   . Colon cancer Neg Hx     Social History Social History   Tobacco Use  . Smoking  status: Current Every Day Smoker    Types: Cigars    Last attempt to quit: 01/23/2015    Years since quitting: 5.0  . Smokeless tobacco: Never Used  . Tobacco comment: one black and mild per day  Substance Use Topics  . Alcohol use: Yes    Comment: rare  . Drug use: No    Review of Systems  Constitutional: Negative for fever. Eyes: Negative for visual changes. ENT: Negative for sore throat. Cardiovascular: Negative for chest pain. Respiratory: Positive for shortness of breath and cough. Gastrointestinal: Negative for abdominal pain, vomiting and diarrhea. Genitourinary: Negative for dysuria. Musculoskeletal: Negative for back pain. Skin: Negative for rash. Neurological: Negative for headaches, focal weakness or numbness. ____________________________________________  PHYSICAL EXAM:  VITAL SIGNS: ED Triage Vitals  Enc Vitals Group     BP 02/23/20 1217 (!) 141/100     Pulse Rate 02/23/20 1217 80     Resp 02/23/20 1217 16     Temp 02/23/20 1217 98.7 F (37.1 C)     Temp Source 02/23/20 1217 Oral     SpO2 02/23/20 1217 100 %     Weight 02/23/20 1218 209 lb (94.8 kg)     Height 02/23/20 1218 5\' 7"  (1.702 m)     Head Circumference --      Peak Flow --      Pain Score 02/23/20 1218 0     Pain Loc --      Pain Edu? --      Excl. in [place]? --  Constitutional: Alert and oriented. Well appearing and in no distress. Head: Normocephalic and atraumatic. Eyes: Conjunctivae are normal. Normal extraocular movements Ears: Canals clear. TMs intact bilaterally. Nose: No congestion/rhinorrhea/epistaxis. Mouth/Throat: Mucous membranes are moist. Neck: Supple. No thyromegaly. Hematological/Lymphatic/Immunological: No cervical lymphadenopathy. Cardiovascular: Normal rate, regular rhythm. Normal distal pulses. Respiratory: Normal respiratory effort. No rales/rhonchi.  Mild end expiratory wheeze noted bilaterally. Gastrointestinal: Soft and nontender. No distention. Musculoskeletal:  Nontender with normal range of motion in all extremities.  Neurologic:  Normal gait without ataxia. Normal speech and language. No gross focal neurologic deficits are appreciated. Skin:  Skin is warm, dry and intact. No rash noted. ____________________________________________   RADIOLOGY  CXR  IMPRESSION: No acute abnormality of the lungs  I, Romain Erion V Bacon-Kharma Sampsel, personally viewed and evaluated these images (plain radiographs) as part of my medical decision making, as well as reviewing the written report by the radiologist. ____________________________________________  PROCEDURES  Prednisone 60 mg PO  Procedures ____________________________________________  INITIAL IMPRESSION / ASSESSMENT AND PLAN / ED COURSE  Patient with ED evaluation of 3-week complaint of intermittent cough.  Patient is with an x-ray does not reveal any acute infectious process.  Medical exam reveals an expiratory wheeze bilaterally.  Vital signs are stable, including O2 sats on room air.  Patient will be treated with steroids, albuterol inhaler, Tessalon Perles for bronchospasm.  She is also encouraged to quit smoking to help with symptoms.  She may take over-the-counter allergy medicines in addition to the cough medicine she has been taking.  She is encouraged to follow-up with primary provider return to the ED as needed.  Alexandra Mullins was evaluated in Emergency Department on 02/23/2020 for the symptoms described in the history of present illness. She was evaluated in the context of the global COVID-19 pandemic, which necessitated consideration that the patient might be at risk for infection with the SARS-CoV-2 virus that causes COVID-19. Institutional protocols and algorithms that pertain to the evaluation of patients at risk for COVID-19 are in a state of rapid change based on information released by regulatory bodies including the CDC and federal and state organizations. These policies and algorithms were  followed during the patient's care in the ED. ___________________________________________  FINAL CLINICAL IMPRESSION(S) / ED DIAGNOSES  Final diagnoses:  Cough      Karmen Stabs, Charlesetta Ivory, PA-C 02/23/20 2014    Jene Every, MD 02/24/20 1751

## 2020-02-23 NOTE — Discharge Instructions (Signed)
Your exam and XR are more consistent with a bronchitis. Take the prescription meds as directed. Follow-up with [facility] or return for continued symptoms.

## 2020-02-23 NOTE — ED Notes (Signed)
First Nurse Note: Pt to ED via POV c/o cough and chest congestion. Pt is in NAD at this time.

## 2020-02-23 NOTE — ED Triage Notes (Signed)
Pt states chest congestion and cough x 3 weeks. Denies fever. A&O, speaking in complete sentences.

## 2020-06-24 ENCOUNTER — Telehealth: Payer: Self-pay | Admitting: General Practice

## 2020-06-24 NOTE — Telephone Encounter (Signed)
Individual has been contacted 3+ times regarding ED referral. No further attempts to contact individual will be made. 

## 2020-08-10 IMAGING — CR DG CHEST 2V
2 series · 2 of 2 positions shown · non-contrast
Comparison: None.

CLINICAL DATA: Cough for 3 weeks

EXAM:
CHEST - 2 VIEW

[chest pa]
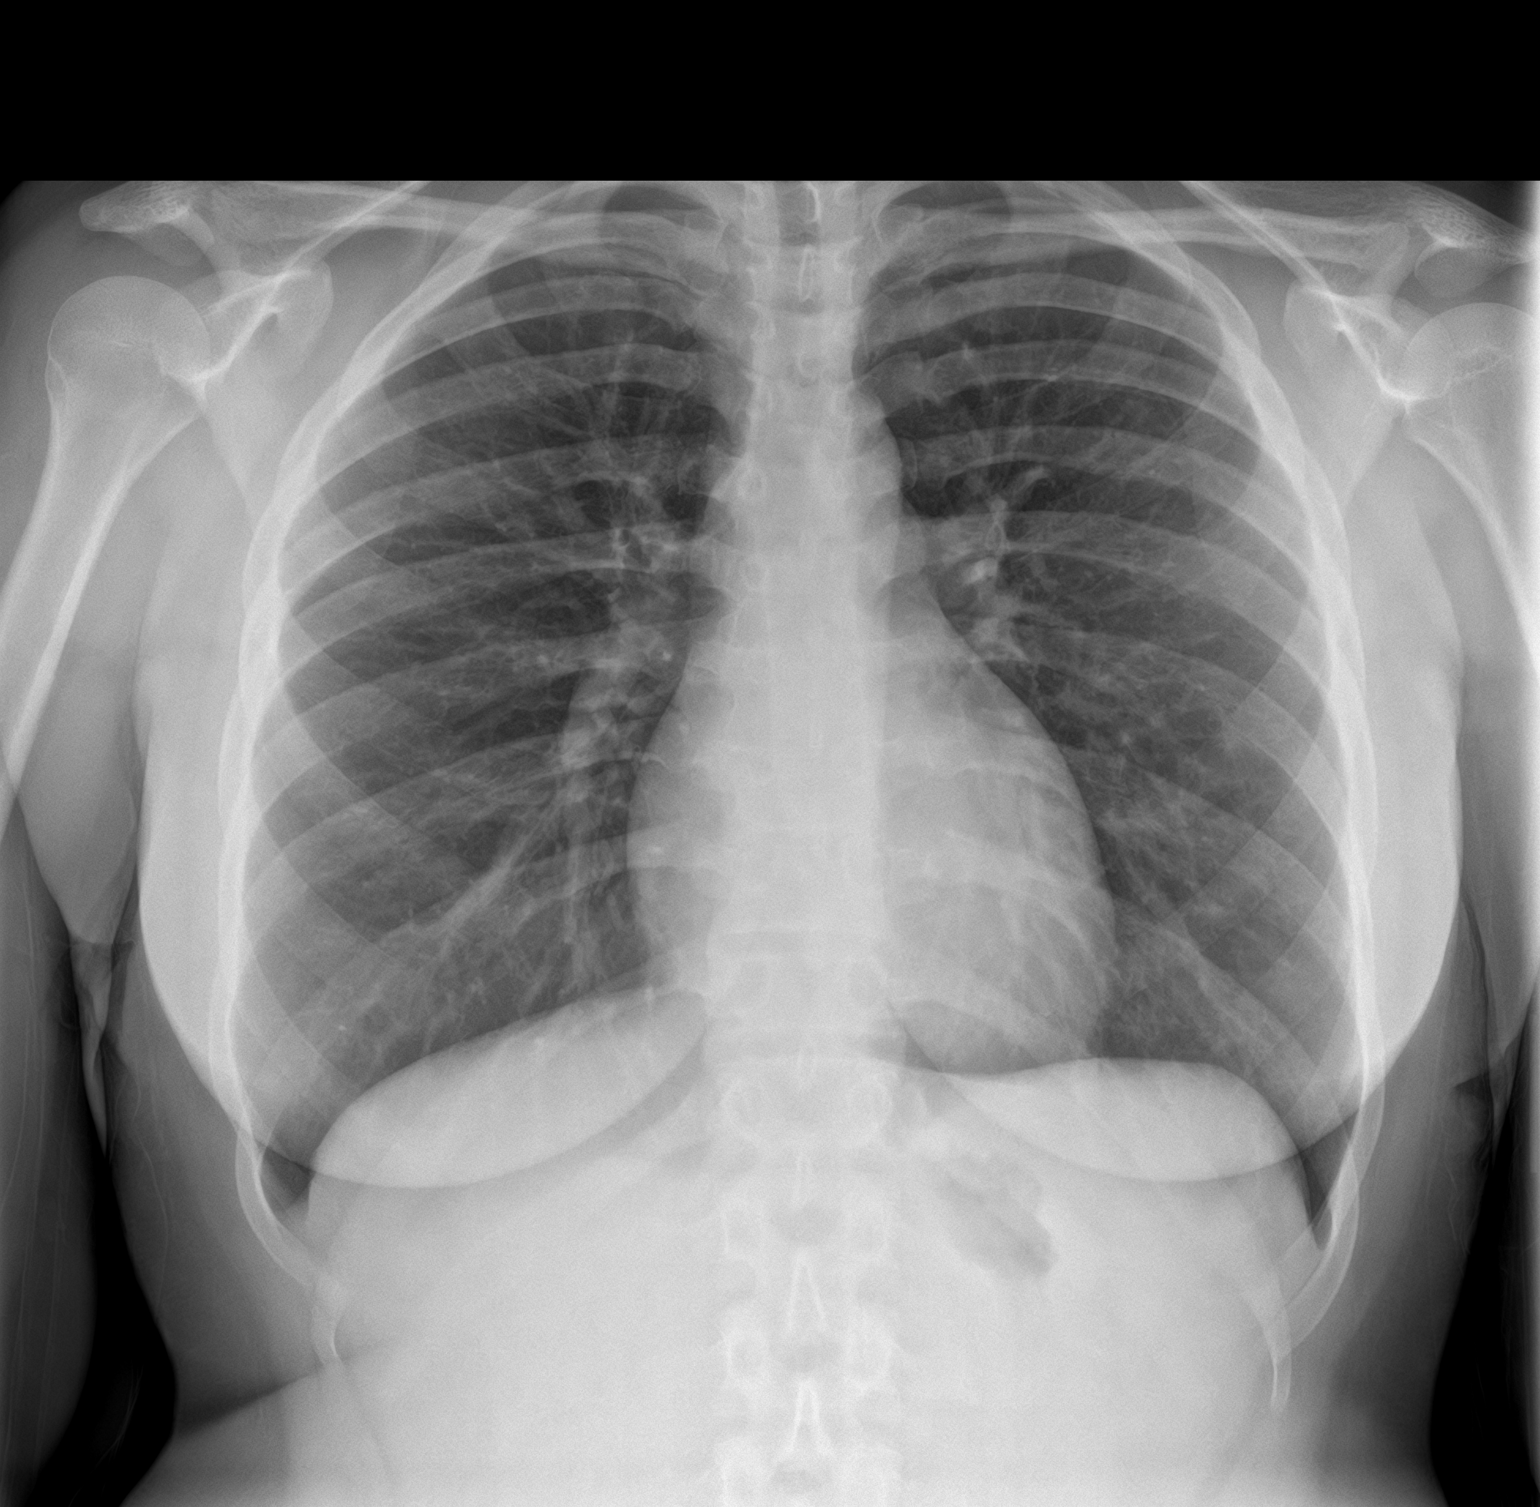

[chest lat]
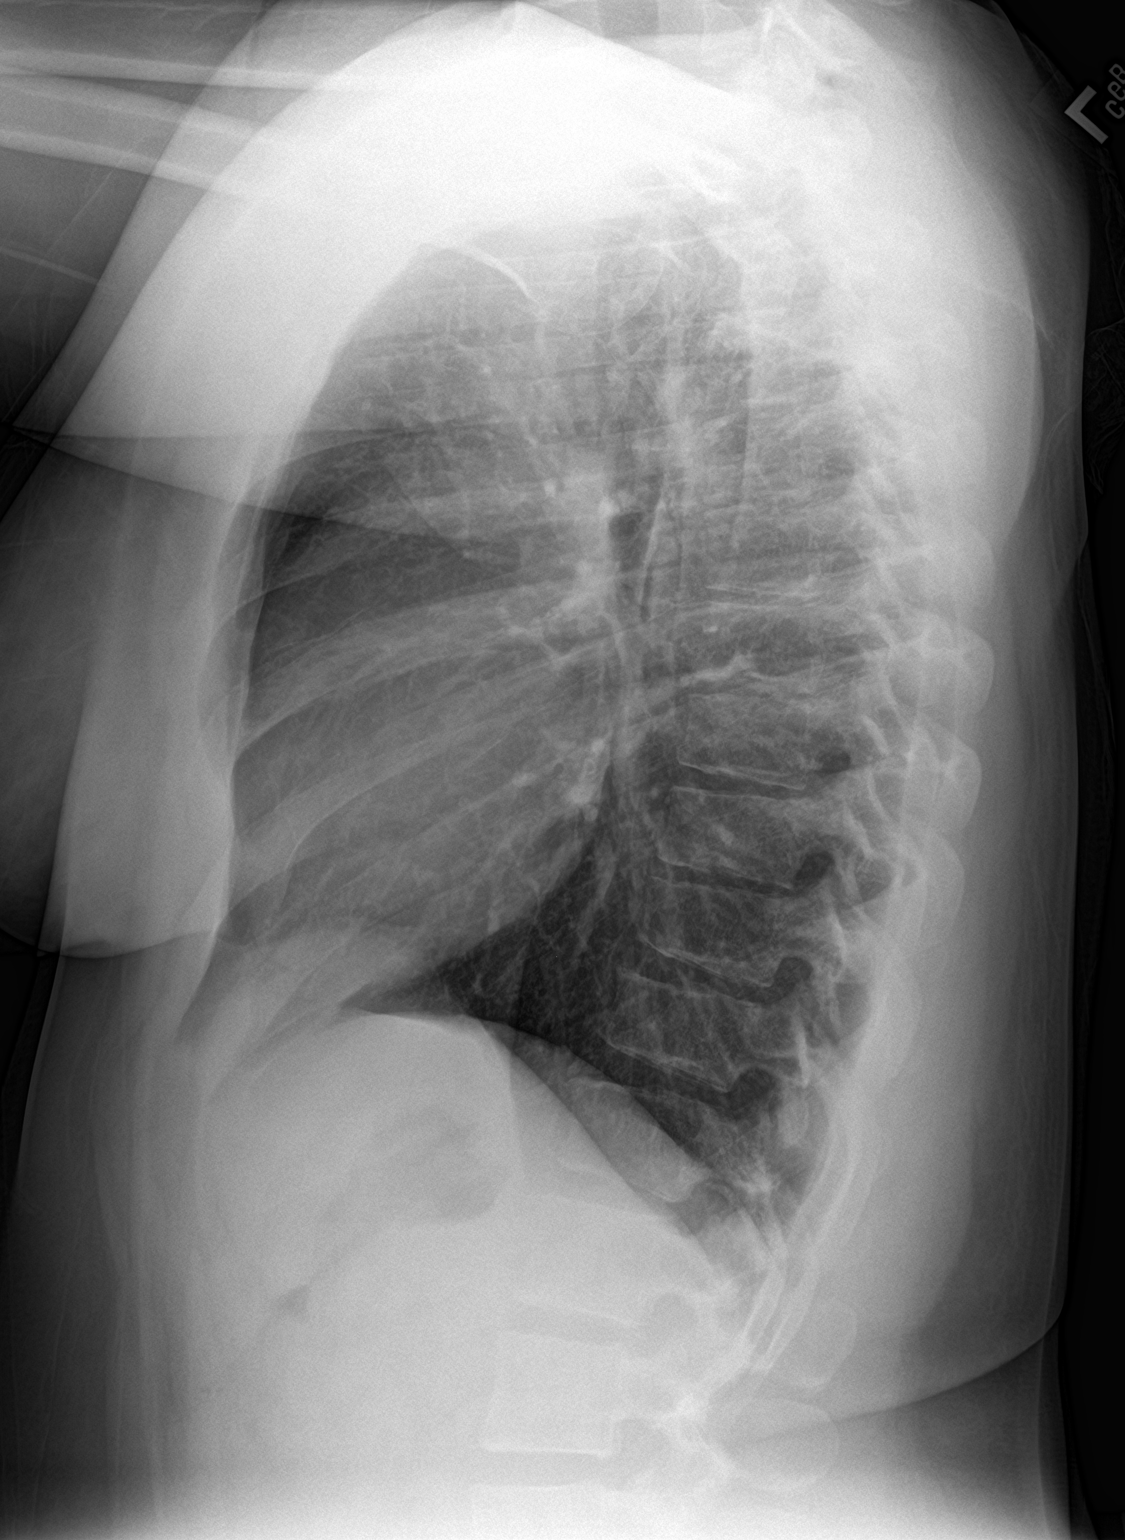

[2 of 2 positions shown; findings below may reference images not displayed]

FINDINGS: The heart size and mediastinal contours are within normal limits.
Both lungs are clear. The visualized skeletal structures are
unremarkable.
IMPRESSION: No acute abnormality of the lungs.
